# Patient Record
Sex: Male | Born: 1965
Health system: Southern US, Community
[De-identification: ages and names within clinical notes are randomized; demographics above are authoritative.]

## PROBLEM LIST (undated history)

## (undated) DIAGNOSIS — K219 Gastro-esophageal reflux disease without esophagitis: Secondary | ICD-10-CM

## (undated) DIAGNOSIS — K8020 Calculus of gallbladder without cholecystitis without obstruction: Secondary | ICD-10-CM

## (undated) DIAGNOSIS — E785 Hyperlipidemia, unspecified: Secondary | ICD-10-CM

## (undated) DIAGNOSIS — E119 Type 2 diabetes mellitus without complications: Secondary | ICD-10-CM

## (undated) DIAGNOSIS — M1990 Unspecified osteoarthritis, unspecified site: Secondary | ICD-10-CM

## (undated) HISTORY — DX: Hyperlipidemia, unspecified: E78.5

## (undated) HISTORY — DX: Gastro-esophageal reflux disease without esophagitis: K21.9

## (undated) HISTORY — DX: Calculus of gallbladder without cholecystitis without obstruction: K80.20

## (undated) HISTORY — DX: Unspecified osteoarthritis, unspecified site: M19.90

## (undated) HISTORY — PX: WISDOM TOOTH EXTRACTION: SHX21

## (undated) HISTORY — DX: Type 2 diabetes mellitus without complications: E11.9

---

## 2006-04-17 ENCOUNTER — Emergency Department (HOSPITAL_COMMUNITY): Admission: EM | Admit: 2006-04-17 | Discharge: 2006-04-17 | Payer: Self-pay | Admitting: Emergency Medicine

## 2006-04-30 ENCOUNTER — Encounter: Admission: RE | Admit: 2006-04-30 | Discharge: 2006-04-30 | Payer: Self-pay | Admitting: Family Medicine

## 2010-07-29 HISTORY — PX: KNEE ARTHROSCOPY: SUR90

## 2011-10-21 ENCOUNTER — Ambulatory Visit (INDEPENDENT_AMBULATORY_CARE_PROVIDER_SITE_OTHER): Payer: BC Managed Care – PPO | Admitting: Cardiovascular Disease

## 2011-10-21 ENCOUNTER — Encounter: Payer: Self-pay | Admitting: Cardiovascular Disease

## 2011-10-21 VITALS — BP 113/78 | HR 68 | Ht 74.0 in | Wt 234.0 lb

## 2011-10-21 DIAGNOSIS — R079 Chest pain, unspecified: Secondary | ICD-10-CM

## 2011-10-21 DIAGNOSIS — R002 Palpitations: Secondary | ICD-10-CM

## 2011-10-21 NOTE — Progress Notes (Signed)
   History of Present Illness: 46 yo male with history of HLD but no history of CAD or FH of CAD who is referred today for evaluation of fatigue, chest discomfort and palpitations. He was seen by Dr. Clelia Croft yesterday. He is an active male in the [employer] and has recently noticed a strange feeling in his chest at peak exercise. He has also felt his heart skipping beats. He was found to have an abnormal lipid profile recently and has been started on Crestor but has not tolerated with muscle weakness and fatigue so he stopped this.   He tells me that he has not been running since knee surgery in 2011. He feels that he is out of shape. He was at peak exercise last week and felt his heart skipping beats. He had a strange sensation in his chest that lasted for several seconds. He had another episode of palpitations the next night while in bed. His heart felt like it was racing. No exertional chest pressure but he does have a strange feeling in his chest with exercising. He has never smoked. No premature CAD in family. No prior cardiac issues.   Primary Care Physician: Buren Kos  Last Lipid Profile: Total chol: 269, HDL: 47, LDL: 139, TG:113  Past Medical History  Diagnosis Date  . Hyperlipidemia     Past Surgical History  Procedure Date  . Knee arthroscopy 5 /2012    Current Outpatient Prescriptions  Medication Sig Dispense Refill  . Ascorbic Acid (VITAMIN C) 100 MG tablet Take 100 mg by mouth daily.      . cholecalciferol (VITAMIN D) 1000 UNITS tablet Take 1,000 Units by mouth daily.        No Known Allergies  History   Social History  . Marital Status: Married    Spouse Name: N/A    Number of Children: N/A  . Years of Education: N/A   Occupational History  . Not on file.   Social History Main Topics  . Smoking status: Never Smoker   . Smokeless tobacco: Not on file  . Alcohol Use: Yes  . Drug Use: Not on file  . Sexually Active: Not on file   Other Topics Concern  . 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 Diabetes type II Mother   . Prostate cancer Father   . Hypertension Father   . Colon cancer Maternal Uncle     Review of Systems:  As stated in the HPI and otherwise negative.   BP 113/78  Pulse 68  Ht 6\' 2"  (1.88 m)  Wt 234 lb (106.142 kg)  BMI 30.04 kg/m2  Physical Examination: General: Well developed, well nourished, NAD HEENT: OP clear, mucus membranes moist SKIN: warm, dry. No rashes. Neuro: No focal deficits Musculoskeletal: Muscle strength 5/5 all ext Psychiatric: Mood and affect normal Neck: No JVD, no carotid bruits, no thyromegaly, no lymphadenopathy. Lungs:Clear bilaterally, no wheezes, rhonci, crackles Cardiovascular: Regular rate and rhythm. No murmurs, gallops or rubs. Abdomen:Soft. Bowel sounds present. Non-tender.  Extremities: No lower extremity edema. Pulses are 2 + in the bilateral DP/PT.  EKG: NSR, rate 68 bpm. Normal EKG

## 2011-10-21 NOTE — Assessment & Plan Note (Signed)
Most likely premature beats. Will arrange 48 hour holter monitor to assess and exclude any other arrythmias.

## 2011-10-21 NOTE — Assessment & Plan Note (Signed)
Atypical chest pain. EKG normal. Given his age, fatigue and hyperlipidemia, will arrange exercise treadmill stress test to evaluate exercise tolerance and exclude ischemia. Will also check echo to assess LV size and function.

## 2011-10-21 NOTE — Patient Instructions (Addendum)
Your physician has requested that you have an echocardiogram. Echocardiography is a painless test that uses sound waves to create images of your heart. It provides your doctor with information about the size and shape of your heart and how well your heart's chambers and valves are working. This procedure takes approximately one hour. There are no restrictions for this procedure.  Your physician has requested that you have an exercise tolerance test. For further information please visit [email or web address] Please also follow instruction sheet, as given.  Your physician has recommended that you wear a holter monitor. Holter monitors are medical devices that record the heart's electrical activity. Doctors most often use these monitors to diagnose arrhythmias. Arrhythmias are problems with the speed or rhythm of the heartbeat. The monitor is a small, portable device. You can wear one while you do your normal daily activities. This is usually used to diagnose what is causing palpitations/syncope (passing out).  Your physician recommends that you schedule a follow-up appointment in: 3-4 weeks

## 2011-11-10 ENCOUNTER — Other Ambulatory Visit: Payer: Self-pay

## 2011-11-10 ENCOUNTER — Ambulatory Visit (HOSPITAL_COMMUNITY): Payer: BC Managed Care – PPO | Attending: Cardiovascular Disease | Admitting: Radiology

## 2011-11-10 ENCOUNTER — Encounter (INDEPENDENT_AMBULATORY_CARE_PROVIDER_SITE_OTHER): Payer: BC Managed Care – PPO

## 2011-11-10 DIAGNOSIS — R079 Chest pain, unspecified: Secondary | ICD-10-CM | POA: Insufficient documentation

## 2011-11-10 DIAGNOSIS — R0989 Other specified symptoms and signs involving the circulatory and respiratory systems: Secondary | ICD-10-CM

## 2011-11-10 DIAGNOSIS — E785 Hyperlipidemia, unspecified: Secondary | ICD-10-CM | POA: Insufficient documentation

## 2011-11-10 DIAGNOSIS — R072 Precordial pain: Secondary | ICD-10-CM | POA: Insufficient documentation

## 2011-11-10 DIAGNOSIS — R002 Palpitations: Secondary | ICD-10-CM | POA: Insufficient documentation

## 2011-11-10 DIAGNOSIS — I517 Cardiomegaly: Secondary | ICD-10-CM | POA: Insufficient documentation

## 2011-11-10 NOTE — Progress Notes (Signed)
Echocardiogram performed.  

## 2011-11-20 ENCOUNTER — Encounter: Payer: Self-pay | Admitting: Cardiovascular Disease

## 2011-11-20 ENCOUNTER — Ambulatory Visit (INDEPENDENT_AMBULATORY_CARE_PROVIDER_SITE_OTHER): Payer: BC Managed Care – PPO | Admitting: Cardiovascular Disease

## 2011-11-20 DIAGNOSIS — R079 Chest pain, unspecified: Secondary | ICD-10-CM

## 2011-11-20 NOTE — Procedures (Signed)
Exercise Treadmill Test  Pre-Exercise Testing Evaluation Rhythm: normal sinus  Rate: 65   PR:  .18 QRS:  .09  QT:  .41 QTc: .42     Test  Exercise Tolerance Test Ordering MD: Melene Muller, MD  Interpreting MD: Melene Muller, MD  Unique Test No: 1  Treadmill:  1  Indication for ETT: chest pain - rule out ischemia  Contraindication to ETT: No   Stress Modality: exercise - treadmill  Cardiac Imaging Performed: non   Protocol: standard Bruce - maximal  Max BP:  159/69  Max MPHR (bpm):  175 85% MPR (bpm):  149  MPHR obtained (bpm):  171 % MPHR obtained:  97%  Reached 85% MPHR (min:sec): 6:50 Total Exercise Time (min-sec):  9 min-30sec  Workload in METS:  10.9 Borg Scale: 15  Reason ETT Terminated:  dyspnea    ST Segment Analysis At Rest: normal ST segments - no evidence of significant ST depression With Exercise: no evidence of significant ST depression  Other Information Arrhythmia:  No Angina during ETT:  absent (0) Quality of ETT:  non-diagnostic  ETT Interpretation:  normal - no evidence of ischemia by ST analysis  Comments: Pt exercised for 9 minutes, 30 seconds achieving target heart rate. No EKG evidence of ischemia. No chest pain. Exercise tolerance is good.   Recommendations: No further ischemic workup.

## 2011-11-25 ENCOUNTER — Telehealth: Payer: Self-pay | Admitting: *Deleted

## 2011-11-25 NOTE — Telephone Encounter (Signed)
Pt did not want to have monitor put on until he had treadmill. Pt did not discuss with Dr. Clifton James at time of treadmill. Per Dr. Clifton James if pt is still having palpitations he should wear monitor. If no palpitations he does not need to wear but should contact us if palpitations occur again. I called pt to discuss and left message for him to call back

## 2011-12-02 NOTE — Telephone Encounter (Signed)
Left message to call back  

## 2011-12-03 NOTE — Telephone Encounter (Signed)
Pt returning my call--asked if having any palpitations, pt states no--advised he won't have to wear monitor if he's not having palps, but if palpitations develop, please call us so we may put monitor on you--pt agrees

## 2011-12-03 NOTE — Telephone Encounter (Signed)
Pt rtn call, added cell number as only number would like a call back

## 2011-12-04 NOTE — Addendum Note (Signed)
Addended by: Dossie Arbour on: 12/04/2011 08:48 AM   Modules accepted: Orders

## 2013-03-20 ENCOUNTER — Encounter: Payer: Self-pay | Admitting: Internal Medicine

## 2013-04-17 ENCOUNTER — Encounter: Payer: Self-pay | Admitting: Internal Medicine

## 2013-04-18 ENCOUNTER — Encounter: Payer: Self-pay | Admitting: Internal Medicine

## 2013-04-18 ENCOUNTER — Ambulatory Visit (INDEPENDENT_AMBULATORY_CARE_PROVIDER_SITE_OTHER): Payer: BC Managed Care – PPO | Admitting: Internal Medicine

## 2013-04-18 VITALS — BP 126/82 | HR 70 | Ht 72.0 in | Wt 245.0 lb

## 2013-04-18 DIAGNOSIS — Z800 Family history of malignant neoplasm of digestive organs: Secondary | ICD-10-CM

## 2013-04-18 DIAGNOSIS — Z1211 Encounter for screening for malignant neoplasm of colon: Secondary | ICD-10-CM

## 2013-04-18 MED ORDER — MOVIPREP 100 G PO SOLR: 1.0000 | Freq: Once | ORAL | Status: DC

## 2013-04-18 NOTE — Progress Notes (Signed)
Patient ID: Garrett Williams, male   DOB: Sep 05, 1965, 48 y.o.   MRN: [id number] HPI: Garrett Williams is a 48 year old male with a past medical history of hyperlipidemia and a family history of colon cancer on both sides of his family who is seen in consultation at the request of Dr. Brigitte Pulse to discuss high-risk colon cancer screening. He is here alone today. He reports he is feeling well. He has no specific GI complaints including no change in bowel habit, diarrhea, constipation, abdominal pain, blood in his stool or melena. No trouble with heartburn, dysphagia or odynophagia. No unexpected weight loss.  He reports his father is deal of colon cancer currently. Diagnosed at age 41. He reports 2 maternal uncles who have had colon cancer.  Past Medical History  Diagnosis Date  . Hyperlipidemia     Past Surgical History  Procedure Laterality Date  . Knee arthroscopy  5 /2012    Current Outpatient Prescriptions  Medication Sig Dispense Refill  . Ascorbic Acid (VITAMIN C) 100 MG tablet Take 100 mg by mouth daily.      . cholecalciferol (VITAMIN D) 1000 UNITS tablet Take 1,000 Units by mouth daily.      . Multiple Vitamin (MULTIVITAMIN) tablet Take 1 tablet by mouth daily.      Marland Kitchen MOVIPREP 100 G SOLR Take 1 kit (200 g total) by mouth once.  1 kit  0   No current facility-administered medications for this visit.    No Known Allergies  Family History  Problem Relation Age of Onset  . Diabetes type II Mother   . Prostate cancer Father   . Hypertension Father   . Colon cancer Maternal Uncle   . Coronary artery disease Neg Hx   . Colon cancer Father 11    History  Substance Use Topics  . Smoking status: Never Smoker   . Smokeless tobacco: Not on file  . Alcohol Use: 0.5 oz/week    1 drink(s) per week    ROS: As per history of present illness, otherwise negative  BP 126/82  Pulse 70  Ht 6' (1.829 m)  Wt 245 lb (111.131 kg)  BMI 33.22 kg/m2 Constitutional: Well-developed and  well-nourished. No distress. HEENT: Normocephalic and atraumatic. Oropharynx is clear and moist. No oropharyngeal exudate. Conjunctivae are normal.  No scleral icterus. Neck: Neck supple. Trachea midline. Cardiovascular: Normal rate, regular rhythm and intact distal pulses. No M/R/G Pulmonary/chest: Effort normal and breath sounds normal. No wheezing, rales or rhonchi. Abdominal: Soft, nontender, nondistended. Bowel sounds active throughout. There are no masses palpable. No hepatosplenomegaly. Extremities: no clubbing, cyanosis, or edema Lymphadenopathy: No cervical adenopathy noted. Neurological: Alert and oriented to person place and time. Skin: Skin is warm and dry. No rashes noted. Psychiatric: Normal mood and affect. Behavior is normal.  ASSESSMENT/PLAN: 48 year old male with a past medical history of hyperlipidemia and a family history of colon cancer on both sides of his family who is seen in consultation at the request of Dr. Brigitte Pulse to discuss high-risk colon cancer screening.  1.  Elevated risk screening for colon cancer/family history of colon cancer -- I have recommended proceeding to screening colonoscopy at this time given his age and family history of colon cancer. We discussed the test today at length including risks and benefits and he is agreeable to proceed.

## 2013-04-18 NOTE — Patient Instructions (Addendum)
You have been scheduled for a colonoscopy with propofol. Please follow written instructions given to you at your visit today.  Please pick up your prep kit at the pharmacy within the next 1-3 days. If you use inhalers (even only as needed), please bring them with you on the day of your procedure. CC:  Lutricia Feil MD

## 2013-04-19 ENCOUNTER — Encounter: Payer: Self-pay | Admitting: Internal Medicine

## 2013-04-21 ENCOUNTER — Telehealth: Payer: Self-pay | Admitting: Internal Medicine

## 2013-04-21 NOTE — Telephone Encounter (Signed)
Pt could not find a caregiver to bring her, so he needs to r/s;done. Will send him a new prep sheet.

## 2013-04-25 ENCOUNTER — Encounter: Payer: BC Managed Care – PPO | Admitting: Internal Medicine

## 2013-04-30 HISTORY — PX: COLONOSCOPY: SHX174

## 2013-05-08 ENCOUNTER — Telehealth: Payer: Self-pay | Admitting: Internal Medicine

## 2013-05-08 NOTE — Telephone Encounter (Signed)
Patient stating he read his directions yesterday after eating almonds around lunch yesterday. Called to be sure its ok that he ate the almonds. Reviewed prep instructions with patient. Patient verbalizing understanding. Patient on clear liquids today.

## 2013-05-09 ENCOUNTER — Encounter: Payer: Self-pay | Admitting: Internal Medicine

## 2013-05-09 ENCOUNTER — Ambulatory Visit (AMBULATORY_SURGERY_CENTER): Payer: BC Managed Care – PPO | Admitting: Internal Medicine

## 2013-05-09 VITALS — BP 124/74 | HR 64 | Temp 97.6°F | Resp 15 | Ht 72.0 in | Wt 245.0 lb

## 2013-05-09 DIAGNOSIS — D126 Benign neoplasm of colon, unspecified: Secondary | ICD-10-CM

## 2013-05-09 DIAGNOSIS — D128 Benign neoplasm of rectum: Secondary | ICD-10-CM

## 2013-05-09 DIAGNOSIS — D129 Benign neoplasm of anus and anal canal: Secondary | ICD-10-CM

## 2013-05-09 DIAGNOSIS — Z1211 Encounter for screening for malignant neoplasm of colon: Secondary | ICD-10-CM

## 2013-05-09 DIAGNOSIS — Z800 Family history of malignant neoplasm of digestive organs: Secondary | ICD-10-CM

## 2013-05-09 MED ORDER — SODIUM CHLORIDE 0.9 % IV SOLN: 500.0000 mL | INTRAVENOUS | Status: DC

## 2013-05-09 NOTE — Patient Instructions (Signed)
YOU HAD AN ENDOSCOPIC PROCEDURE TODAY AT THE [facility]: Refer to the procedure report that was given to you for any specific questions about what was found during the examination.  If the procedure report does not answer your questions, please call your gastroenterologist to clarify.  If you requested that your care partner not be given the details of your procedure findings, then the procedure report has been included in a sealed envelope for you to review at your convenience later.  YOU SHOULD EXPECT: Some feelings of bloating in the abdomen. Passage of more gas than usual.  Walking can help get rid of the air that was put into your GI tract during the procedure and reduce the bloating. If you had a lower endoscopy (such as a colonoscopy or flexible sigmoidoscopy) you may notice spotting of blood in your stool or on the toilet paper. If you underwent a bowel prep for your procedure, then you may not have a normal bowel movement for a few days.  DIET: Your first meal following the procedure should be a light meal and then it is ok to progress to your normal diet.  A half-sandwich or bowl of soup is an example of a good first meal.  Heavy or fried foods are harder to digest and may make you feel nauseous or bloated.  Likewise meals heavy in dairy and vegetables can cause extra gas to form and this can also increase the bloating.  Drink plenty of fluids but you should avoid alcoholic beverages for 24 hours.  ACTIVITY: Your care partner should take you home directly after the procedure.  You should plan to take it easy, moving slowly for the rest of the day.  You can resume normal activity the day after the procedure however you should NOT DRIVE or use heavy machinery for 24 hours (because of the sedation medicines used during the test).    SYMPTOMS TO REPORT IMMEDIATELY: A gastroenterologist can be reached at any hour.  During normal business hours, 8:30 AM to 5:00 PM Monday through Friday,  call [phone].  After hours and on weekends, please call the GI answering service at [phone] who will take a message and have the physician on call contact you.   Following lower endoscopy (colonoscopy or flexible sigmoidoscopy):  Excessive amounts of blood in the stool  Significant tenderness or worsening of abdominal pains  Swelling of the abdomen that is new, acute  Fever of 100F or higher  FOLLOW UP: If any biopsies were taken you will be contacted by phone or by letter within the next 1-3 weeks.  Call your gastroenterologist if you have not heard about the biopsies in 3 weeks.  Our staff will call the home number listed on your records the next business day following your procedure to check on you and address any questions or concerns that you may have at that time regarding the information given to you following your procedure. This is a courtesy call and so if there is no answer at the home number and we have not heard from you through the emergency physician on call, we will assume that you have returned to your regular daily activities without incident.  SIGNATURES/CONFIDENTIALITY: You and/or your care partner have signed paperwork which will be entered into your electronic medical record.  These signatures attest to the fact that that the information above on your After Visit Summary has been reviewed and is understood.  Full responsibility of the confidentiality of this   discharge information lies with you and/or your care-partner.  Recommendations Await pathology results Timing of repeat colonoscopy will be determined by pathology findings

## 2013-05-09 NOTE — Op Note (Signed)
[facility]  [address] [place], [address]   COLONOSCOPY PROCEDURE REPORT  PATIENT: Garrett Williams, Garrett Williams  MR#: [id number] BIRTHDATE: [birth year] , 81  yrs. old GENDER: Male ENDOSCOPIST: Jerene Bears, MD REFERRED BY:W.  Lutricia Feil, M.D. PROCEDURE DATE:  05/09/2013 PROCEDURE:   Colonoscopy with snare polypectomy and Colonoscopy with cold biopsy polypectomy First Screening Colonoscopy - Avg.  risk and is 50 yrs.  old or older Yes.  Prior Negative Screening - Now for repeat screening. N/A  History of Adenoma - Now for follow-up colonoscopy & has been > or = to 3 yrs.  N/A  Polyps Removed Today? Yes. ASA CLASS:   Class II INDICATIONS:elevated risk screening, Patient's immediate family history of colon cancer, and first colonoscopy. MEDICATIONS: MAC sedation, administered by CRNA and propofol (Diprivan) 400mg  IV  DESCRIPTION OF PROCEDURE:   After the risks benefits and alternatives of the procedure were thoroughly explained, informed consent was obtained.  A digital rectal exam revealed no rectal mass.   The LB [id number]  endoscope was introduced through the anus and advanced to the cecum, which was identified by both the appendix and ileocecal valve. No adverse events experienced. The quality of the prep was good, using MoviPrep  The instrument was then slowly withdrawn as the colon was fully examined.  COLON FINDINGS: Two sessile polyps ranging between 3-58mm in size with mucous caps were found at the cecum.  Polypectomy was performed with cold forceps and using cold snare.  All resections were complete and all polyp tissue was completely retrieved.   Two sessile polyps measuring 2-4 mm in size were found in the rectum. Polypectomy was performed with cold forceps.  All resections were complete and all polyp tissue was completely retrieved.   The colon mucosa was otherwise normal.  Retroflexed views revealed no abnormalities. The time to cecum=2 minutes 02  seconds.  Withdrawal time=16 minutes 03 seconds.  The scope was withdrawn and the procedure completed. COMPLICATIONS: There were no complications.  ENDOSCOPIC IMPRESSION: 1.   Two sessile polyps ranging between 3-85mm in size were found at the cecum; Polypectomy was performed with cold forceps and using cold snare 2.   Two sessile polyps measuring 2-4 mm in size were found in the rectum; Polypectomy was performed with cold forceps 3.   The colon mucosa was otherwise normal  RECOMMENDATIONS: 1.  Await pathology results 2.  Timing of repeat colonoscopy will be determined by pathology findings. 3.  You will receive a letter within 1-2 weeks with the results of your biopsy as well as final recommendations.  Please call my office if you have not received a letter after 3 weeks.   eSigned:  Jerene Bears, MD 05/09/2013 3:33 PM       cc: The Patient and W.  Lutricia Feil, MD

## 2013-05-09 NOTE — Progress Notes (Signed)
Report to pacu rn, vss, bbs=clear 

## 2013-05-09 NOTE — Progress Notes (Signed)
Called to room to assist during endoscopic procedure.  Patient ID and intended procedure confirmed with present staff. Received instructions for my participation in the procedure from the performing physician.  

## 2013-05-10 ENCOUNTER — Telehealth: Payer: Self-pay | Admitting: *Deleted

## 2013-05-10 NOTE — Telephone Encounter (Signed)
Lm on vm to return call if problems or questions on number given in admitting yesterday. ewm

## 2013-05-21 ENCOUNTER — Encounter: Payer: Self-pay | Admitting: Internal Medicine

## 2013-05-26 ENCOUNTER — Telehealth: Payer: Self-pay | Admitting: Internal Medicine

## 2013-05-26 NOTE — Telephone Encounter (Signed)
Left message for patient to call back  

## 2013-05-29 NOTE — Telephone Encounter (Signed)
Only 2 of the polyps, the 2 from the cecum, where sessile serrated polyps The rectal polyps were benign and NOT adenomatous Based on these findings, national surveillance guidelines support repeat colonoscopy in 5 years, even in light of family history I'm happy to discuss this with him by phone if you would like

## 2013-05-29 NOTE — Telephone Encounter (Signed)
Left message for patient to call back  

## 2013-05-29 NOTE — Telephone Encounter (Signed)
Patient states that you and he discussed a 3 year recall if his polyps are adenomatous.  He has a history of colon cancer in his father.  Please review and advise

## 2013-05-30 NOTE — Telephone Encounter (Signed)
Left message for patient to call back  

## 2013-05-30 NOTE — Telephone Encounter (Signed)
Patient notified.  He will call back for any additional questions or concerns.  

## 2015-12-10 ENCOUNTER — Other Ambulatory Visit: Payer: Self-pay | Admitting: Internal Medicine

## 2015-12-10 DIAGNOSIS — IMO0001 Reserved for inherently not codable concepts without codable children: Secondary | ICD-10-CM

## 2015-12-10 DIAGNOSIS — R209 Unspecified disturbances of skin sensation: Principal | ICD-10-CM

## 2015-12-20 ENCOUNTER — Other Ambulatory Visit: Payer: Self-pay

## 2016-02-19 DIAGNOSIS — Z0000 Encounter for general adult medical examination without abnormal findings: Secondary | ICD-10-CM | POA: Diagnosis not present

## 2016-02-19 DIAGNOSIS — Z1389 Encounter for screening for other disorder: Secondary | ICD-10-CM | POA: Diagnosis not present

## 2016-02-19 DIAGNOSIS — E298 Other testicular dysfunction: Secondary | ICD-10-CM | POA: Diagnosis not present

## 2016-02-19 DIAGNOSIS — E784 Other hyperlipidemia: Secondary | ICD-10-CM | POA: Diagnosis not present

## 2016-12-03 DIAGNOSIS — Z6831 Body mass index (BMI) 31.0-31.9, adult: Secondary | ICD-10-CM | POA: Diagnosis not present

## 2016-12-03 DIAGNOSIS — R351 Nocturia: Secondary | ICD-10-CM | POA: Diagnosis not present

## 2016-12-03 DIAGNOSIS — N410 Acute prostatitis: Secondary | ICD-10-CM | POA: Diagnosis not present

## 2017-03-09 DIAGNOSIS — Z125 Encounter for screening for malignant neoplasm of prostate: Secondary | ICD-10-CM | POA: Diagnosis not present

## 2017-03-09 DIAGNOSIS — Z0000 Encounter for general adult medical examination without abnormal findings: Secondary | ICD-10-CM | POA: Diagnosis not present

## 2017-03-09 DIAGNOSIS — E298 Other testicular dysfunction: Secondary | ICD-10-CM | POA: Diagnosis not present

## 2017-03-09 DIAGNOSIS — Z1389 Encounter for screening for other disorder: Secondary | ICD-10-CM | POA: Diagnosis not present

## 2017-03-09 DIAGNOSIS — Z8042 Family history of malignant neoplasm of prostate: Secondary | ICD-10-CM | POA: Diagnosis not present

## 2017-03-09 DIAGNOSIS — E668 Other obesity: Secondary | ICD-10-CM | POA: Diagnosis not present

## 2017-03-09 DIAGNOSIS — E7849 Other hyperlipidemia: Secondary | ICD-10-CM | POA: Diagnosis not present

## 2017-04-21 DIAGNOSIS — G8929 Other chronic pain: Secondary | ICD-10-CM | POA: Diagnosis not present

## 2017-04-21 DIAGNOSIS — M25561 Pain in right knee: Secondary | ICD-10-CM | POA: Diagnosis not present

## 2017-04-21 DIAGNOSIS — S83231A Complex tear of medial meniscus, current injury, right knee, initial encounter: Secondary | ICD-10-CM | POA: Diagnosis not present

## 2017-04-22 DIAGNOSIS — K219 Gastro-esophageal reflux disease without esophagitis: Secondary | ICD-10-CM | POA: Diagnosis not present

## 2017-04-22 DIAGNOSIS — R0602 Shortness of breath: Secondary | ICD-10-CM | POA: Diagnosis not present

## 2017-04-22 DIAGNOSIS — R0789 Other chest pain: Secondary | ICD-10-CM | POA: Diagnosis not present

## 2017-04-22 DIAGNOSIS — R079 Chest pain, unspecified: Secondary | ICD-10-CM | POA: Diagnosis not present

## 2017-05-07 DIAGNOSIS — R079 Chest pain, unspecified: Secondary | ICD-10-CM | POA: Diagnosis not present

## 2017-10-28 ENCOUNTER — Other Ambulatory Visit: Payer: Self-pay | Admitting: Internal Medicine

## 2017-10-28 DIAGNOSIS — K219 Gastro-esophageal reflux disease without esophagitis: Secondary | ICD-10-CM | POA: Diagnosis not present

## 2017-10-28 DIAGNOSIS — M546 Pain in thoracic spine: Secondary | ICD-10-CM | POA: Diagnosis not present

## 2017-10-28 DIAGNOSIS — R1013 Epigastric pain: Secondary | ICD-10-CM | POA: Diagnosis not present

## 2017-10-28 DIAGNOSIS — R351 Nocturia: Secondary | ICD-10-CM | POA: Diagnosis not present

## 2017-10-28 DIAGNOSIS — R109 Unspecified abdominal pain: Secondary | ICD-10-CM

## 2017-11-03 ENCOUNTER — Other Ambulatory Visit: Payer: Self-pay

## 2017-11-05 DIAGNOSIS — K760 Fatty (change of) liver, not elsewhere classified: Secondary | ICD-10-CM | POA: Diagnosis not present

## 2017-11-05 DIAGNOSIS — K8020 Calculus of gallbladder without cholecystitis without obstruction: Secondary | ICD-10-CM | POA: Diagnosis not present

## 2017-11-08 DIAGNOSIS — K8020 Calculus of gallbladder without cholecystitis without obstruction: Secondary | ICD-10-CM | POA: Diagnosis not present

## 2017-11-08 DIAGNOSIS — Z1211 Encounter for screening for malignant neoplasm of colon: Secondary | ICD-10-CM | POA: Diagnosis not present

## 2017-11-08 DIAGNOSIS — Z800 Family history of malignant neoplasm of digestive organs: Secondary | ICD-10-CM | POA: Diagnosis not present

## 2018-03-08 DIAGNOSIS — R82998 Other abnormal findings in urine: Secondary | ICD-10-CM | POA: Diagnosis not present

## 2018-03-08 DIAGNOSIS — E7849 Other hyperlipidemia: Secondary | ICD-10-CM | POA: Diagnosis not present

## 2018-03-08 DIAGNOSIS — Z0000 Encounter for general adult medical examination without abnormal findings: Secondary | ICD-10-CM | POA: Diagnosis not present

## 2018-03-08 DIAGNOSIS — R7301 Impaired fasting glucose: Secondary | ICD-10-CM | POA: Diagnosis not present

## 2018-03-15 DIAGNOSIS — Z125 Encounter for screening for malignant neoplasm of prostate: Secondary | ICD-10-CM | POA: Diagnosis not present

## 2018-03-15 DIAGNOSIS — R7301 Impaired fasting glucose: Secondary | ICD-10-CM | POA: Diagnosis not present

## 2018-03-15 DIAGNOSIS — K8020 Calculus of gallbladder without cholecystitis without obstruction: Secondary | ICD-10-CM | POA: Diagnosis not present

## 2018-03-15 DIAGNOSIS — E7849 Other hyperlipidemia: Secondary | ICD-10-CM | POA: Diagnosis not present

## 2018-03-15 DIAGNOSIS — Z0000 Encounter for general adult medical examination without abnormal findings: Secondary | ICD-10-CM | POA: Diagnosis not present

## 2018-03-15 DIAGNOSIS — Z6832 Body mass index (BMI) 32.0-32.9, adult: Secondary | ICD-10-CM | POA: Diagnosis not present

## 2018-03-15 DIAGNOSIS — M25561 Pain in right knee: Secondary | ICD-10-CM | POA: Diagnosis not present

## 2018-05-05 ENCOUNTER — Encounter: Payer: Self-pay | Admitting: *Deleted

## 2018-05-28 ENCOUNTER — Encounter: Payer: Self-pay | Admitting: Internal Medicine

## 2018-07-21 DIAGNOSIS — R7301 Impaired fasting glucose: Secondary | ICD-10-CM | POA: Diagnosis not present

## 2018-07-21 DIAGNOSIS — M25561 Pain in right knee: Secondary | ICD-10-CM | POA: Diagnosis not present

## 2018-09-19 DIAGNOSIS — E785 Hyperlipidemia, unspecified: Secondary | ICD-10-CM | POA: Diagnosis not present

## 2018-09-19 DIAGNOSIS — R7301 Impaired fasting glucose: Secondary | ICD-10-CM | POA: Diagnosis not present

## 2018-09-19 DIAGNOSIS — K219 Gastro-esophageal reflux disease without esophagitis: Secondary | ICD-10-CM | POA: Diagnosis not present

## 2018-09-19 DIAGNOSIS — E669 Obesity, unspecified: Secondary | ICD-10-CM | POA: Diagnosis not present

## 2018-12-16 DIAGNOSIS — E119 Type 2 diabetes mellitus without complications: Secondary | ICD-10-CM | POA: Diagnosis not present

## 2018-12-16 DIAGNOSIS — H40033 Anatomical narrow angle, bilateral: Secondary | ICD-10-CM | POA: Diagnosis not present

## 2019-01-03 ENCOUNTER — Encounter: Payer: Self-pay | Admitting: Internal Medicine

## 2019-01-13 DIAGNOSIS — R1013 Epigastric pain: Secondary | ICD-10-CM | POA: Diagnosis not present

## 2019-01-13 DIAGNOSIS — K8020 Calculus of gallbladder without cholecystitis without obstruction: Secondary | ICD-10-CM | POA: Diagnosis not present

## 2019-01-18 ENCOUNTER — Other Ambulatory Visit: Payer: Self-pay | Admitting: Internal Medicine

## 2019-01-18 DIAGNOSIS — R1013 Epigastric pain: Secondary | ICD-10-CM

## 2019-01-27 ENCOUNTER — Ambulatory Visit
Admission: RE | Admit: 2019-01-27 | Discharge: 2019-01-27 | Disposition: A | Discharge status: Home / Self Care | Payer: Self-pay | Source: Ambulatory Visit | Attending: Internal Medicine | Admitting: Internal Medicine

## 2019-01-27 DIAGNOSIS — R1013 Epigastric pain: Secondary | ICD-10-CM

## 2019-01-27 DIAGNOSIS — K8020 Calculus of gallbladder without cholecystitis without obstruction: Secondary | ICD-10-CM | POA: Diagnosis not present

## 2019-01-27 DIAGNOSIS — K760 Fatty (change of) liver, not elsewhere classified: Secondary | ICD-10-CM | POA: Diagnosis not present

## 2019-02-06 ENCOUNTER — Other Ambulatory Visit: Payer: Self-pay

## 2019-02-06 ENCOUNTER — Ambulatory Visit (AMBULATORY_SURGERY_CENTER): Payer: BC Managed Care – PPO | Admitting: *Deleted

## 2019-02-06 VITALS — Ht 72.0 in | Wt 248.4 lb

## 2019-02-06 DIAGNOSIS — Z1159 Encounter for screening for other viral diseases: Secondary | ICD-10-CM

## 2019-02-06 DIAGNOSIS — Z86010 Personal history of colonic polyps: Secondary | ICD-10-CM

## 2019-02-06 MED ORDER — SUPREP BOWEL PREP KIT 17.5-3.13-1.6 GM/177ML PO SOLN: 1.0000 | Freq: Once | ORAL | 0 refills | Status: AC

## 2019-02-06 NOTE — Progress Notes (Signed)
Patient denies any allergies to egg or soy products. Patient denies complications with anesthesia/sedation.  Patient denies oxygen use at home and denies diet medications. Emmi instructions for colonoscopy/endoscopy explained and given to patient.   

## 2019-02-07 ENCOUNTER — Encounter: Payer: Self-pay | Admitting: Internal Medicine

## 2019-02-15 ENCOUNTER — Other Ambulatory Visit: Payer: Self-pay | Admitting: Internal Medicine

## 2019-02-15 ENCOUNTER — Ambulatory Visit (INDEPENDENT_AMBULATORY_CARE_PROVIDER_SITE_OTHER): Payer: BLUE CROSS/BLUE SHIELD

## 2019-02-15 DIAGNOSIS — Z1159 Encounter for screening for other viral diseases: Secondary | ICD-10-CM

## 2019-02-16 LAB — SARS CORONAVIRUS 2 (TAT 6-24 HRS): SARS Coronavirus 2: NEGATIVE

## 2019-02-19 DIAGNOSIS — Z1211 Encounter for screening for malignant neoplasm of colon: Secondary | ICD-10-CM | POA: Diagnosis not present

## 2019-02-20 ENCOUNTER — Other Ambulatory Visit: Payer: Self-pay

## 2019-02-20 ENCOUNTER — Ambulatory Visit (AMBULATORY_SURGERY_CENTER): Admitting: Internal Medicine

## 2019-02-20 ENCOUNTER — Encounter: Payer: Self-pay | Admitting: Internal Medicine

## 2019-02-20 VITALS — BP 112/75 | HR 60 | Temp 98.7°F | Resp 16 | Ht 72.0 in | Wt 248.4 lb

## 2019-02-20 DIAGNOSIS — D124 Benign neoplasm of descending colon: Secondary | ICD-10-CM

## 2019-02-20 DIAGNOSIS — Z800 Family history of malignant neoplasm of digestive organs: Secondary | ICD-10-CM | POA: Diagnosis not present

## 2019-02-20 DIAGNOSIS — D123 Benign neoplasm of transverse colon: Secondary | ICD-10-CM | POA: Diagnosis not present

## 2019-02-20 DIAGNOSIS — Z86010 Personal history of colonic polyps: Secondary | ICD-10-CM

## 2019-02-20 MED ORDER — SODIUM CHLORIDE 0.9 % IV SOLN: 500.0000 mL | Freq: Once | INTRAVENOUS | Status: DC

## 2019-02-20 NOTE — Progress Notes (Signed)
VS- [facility] Temperature- June Bullock  Pt's states no medical or surgical changes since previsit or office visit.

## 2019-02-20 NOTE — Op Note (Signed)
[place] Patient Name: Garrett Williams Procedure Date: 02/20/2019 9:11 AM MRN: [id number] Endoscopist: Jerene Bears , MD Age: 53 Referring MD:  Date of Birth: 1965-06-28 Gender: Male Account #: [id number] Procedure:                Colonoscopy Indications:              High risk colon cancer surveillance: Personal                            history of non-advanced adenoma, Family history of                            colon cancer in a first-degree relative, Last                            colonoscopy 5 years ago Medicines:                Monitored Anesthesia Care Procedure:                Pre-Anesthesia Assessment:                           - Prior to the procedure, a History and Physical                            was performed, and patient medications and                            allergies were reviewed. The patient's tolerance of                            previous anesthesia was also reviewed. The risks                            and benefits of the procedure and the sedation                            options and risks were discussed with the patient.                            All questions were answered, and informed consent                            was obtained. Prior Anticoagulants: The patient has                            taken no previous anticoagulant or antiplatelet                            agents. ASA Grade Assessment: II - A patient with                            mild systemic disease. After reviewing the risks  and benefits, the patient was deemed in                            satisfactory condition to undergo the procedure.                           After obtaining informed consent, the colonoscope                            was passed under direct vision. Throughout the                            procedure, the patient's blood pressure, pulse, and                            oxygen saturations were monitored continuously.  The                            Colonoscope was introduced through the anus and                            advanced to the cecum, identified by appendiceal                            orifice and ileocecal valve. The colonoscopy was                            performed without difficulty. The patient tolerated                            the procedure well. The quality of the bowel                            preparation was excellent. The ileocecal valve,                            appendiceal orifice, and rectum were photographed. Scope In: 9:19:13 AM Scope Out: 9:37:02 AM Scope Withdrawal Time: 0 hours 14 minutes 25 seconds  Total Procedure Duration: 0 hours 17 minutes 49 seconds  Findings:                 The digital rectal exam was normal.                           A 2 mm polyp was found in the transverse colon. The                            polyp was sessile. The polyp was removed with a                            cold biopsy forceps. Resection and retrieval were                            complete.  Two sessile polyps were found in the descending                            colon. The polyps were 2 to 3 mm in size. These                            polyps were removed with a cold biopsy forceps.                            Resection and retrieval were complete.                           A few small-mouthed diverticula were found in the                            sigmoid colon, descending colon and ascending colon.                           Internal hemorrhoids were found during retroflexion. Complications:            No immediate complications. Estimated Blood Loss:     Estimated blood loss: none. Impression:               - One 2 mm polyp in the transverse colon, removed                            with a cold biopsy forceps. Resected and retrieved.                           - Two 2 to 3 mm polyps in the descending colon,                            removed with  a cold biopsy forceps. Resected and                            retrieved.                           - Diverticulosis in the sigmoid colon, in the                            descending colon and in the ascending colon.                           - Internal hemorrhoids. Recommendation:           - Patient has a contact number available for                            emergencies. The signs and symptoms of potential                            delayed complications were discussed with the  patient. Return to normal activities tomorrow.                            Written discharge instructions were provided to the                            patient.                           - Resume previous diet.                           - Continue present medications.                           - Await pathology results.                           - Repeat colonoscopy in 5 years for surveillance. Jerene Bears, MD 02/20/2019 9:43:50 AM This report has been signed electronically.

## 2019-02-20 NOTE — Patient Instructions (Signed)
Thank you for allowing [place] to care for you today!  Await pathology results by mail, approximately 2 weeks.    Recommend repeat colonoscopy in 5 years for surveillance.  Resume previous diet and medications today.  Return to your normal activities tomorrow.     YOU HAD AN ENDOSCOPIC PROCEDURE TODAY AT [facility]:   Refer to the procedure report that was given to you for any specific questions about what was found during the examination.  If the procedure report does not answer your questions, please call your gastroenterologist to clarify.  If you requested that your care partner not be given the details of your procedure findings, then the procedure report has been included in a sealed envelope for you to review at your convenience later.  YOU SHOULD EXPECT: Some feelings of bloating in the abdomen. Passage of more gas than usual.  Walking can help get rid of the air that was put into your GI tract during the procedure and reduce the bloating. If you had a lower endoscopy (such as a colonoscopy or flexible sigmoidoscopy) you may notice spotting of blood in your stool or on the toilet paper. If you underwent a bowel prep for your procedure, you may not have a normal bowel movement for a few days.  Please Note:  You might notice some irritation and congestion in your nose or some drainage.  This is from the oxygen used during your procedure.  There is no need for concern and it should clear up in a day or so.  SYMPTOMS TO REPORT IMMEDIATELY:   Following lower endoscopy (colonoscopy or flexible sigmoidoscopy):  Excessive amounts of blood in the stool  Significant tenderness or worsening of abdominal pains  Swelling of the abdomen that is new, acute  Fever of 100F or higher   For urgent or emergent issues, a gastroenterologist can be reached at any hour by calling [phone].   DIET:  We do recommend a small meal at first, but then you may proceed to your regular diet.   Drink plenty of fluids but you should avoid alcoholic beverages for 24 hours.  ACTIVITY:  You should plan to take it easy for the rest of today and you should NOT DRIVE or use heavy machinery until tomorrow (because of the sedation medicines used during the test).    FOLLOW UP: Our staff will call the number listed on your records 48-72 hours following your procedure to check on you and address any questions or concerns that you may have regarding the information given to you following your procedure. If we do not reach you, we will leave a message.  We will attempt to reach you two times.  During this call, we will ask if you have developed any symptoms of COVID 19. If you develop any symptoms (ie: fever, flu-like symptoms, shortness of breath, cough etc.) before then, please call [phone].  If you test positive for Covid 19 in the 2 weeks post procedure, please call and report this information to [place].    If any biopsies were taken you will be contacted by phone or by letter within the next 1-3 weeks.  Please call us at [phone] if you have not heard about the biopsies in 3 weeks.    SIGNATURES/CONFIDENTIALITY: You and/or your care partner have signed paperwork which will be entered into your electronic medical record.  These signatures attest to the fact that that the information above on your After Visit Summary has  been reviewed and is understood.  Full responsibility of the confidentiality of this discharge information lies with you and/or your care-partner.

## 2019-02-20 NOTE — Progress Notes (Signed)
Called to room to assist during endoscopic procedure.  Patient ID and intended procedure confirmed with present staff. Received instructions for my participation in the procedure from the performing physician.  

## 2019-02-20 NOTE — Progress Notes (Signed)
Report given to PACU, vss 

## 2019-02-22 ENCOUNTER — Telehealth: Payer: Self-pay | Admitting: *Deleted

## 2019-02-22 NOTE — Telephone Encounter (Signed)
  Follow up Call-  Call back number 02/20/2019  Post procedure Call Back phone  # [phone]  Permission to leave phone message Yes  Some recent data might be hidden     Patient questions:  Do you have a fever, pain , or abdominal swelling? No. Pain Score  0 *  Have you tolerated food without any problems? Yes.    Have you been able to return to your normal activities? Yes.    Do you have any questions about your discharge instructions: Diet   No. Medications  No. Follow up visit  No.  Do you have questions or concerns about your Care? No.  Actions: * If pain score is 4 or above: No action needed, pain <4.  1. Have you developed a fever since your procedure? no  2.   Have you had an respiratory symptoms (SOB or cough) since your procedure? no  3.   Have you tested positive for COVID 19 since your procedure no  4.   Have you had any family members/close contacts diagnosed with the COVID 19 since your procedure?  no   If yes to any of these questions please route to Joylene John, RN and Alphonsa Gin, Therapist, sports.

## 2019-02-27 ENCOUNTER — Encounter: Payer: Self-pay | Admitting: Internal Medicine

## 2019-03-20 DIAGNOSIS — E7849 Other hyperlipidemia: Secondary | ICD-10-CM | POA: Diagnosis not present

## 2019-03-20 DIAGNOSIS — Z125 Encounter for screening for malignant neoplasm of prostate: Secondary | ICD-10-CM | POA: Diagnosis not present

## 2019-03-20 DIAGNOSIS — R7301 Impaired fasting glucose: Secondary | ICD-10-CM | POA: Diagnosis not present

## 2019-04-04 DIAGNOSIS — Z0000 Encounter for general adult medical examination without abnormal findings: Secondary | ICD-10-CM | POA: Diagnosis not present

## 2019-04-04 DIAGNOSIS — E785 Hyperlipidemia, unspecified: Secondary | ICD-10-CM | POA: Diagnosis not present

## 2019-04-04 DIAGNOSIS — E298 Other testicular dysfunction: Secondary | ICD-10-CM | POA: Diagnosis not present

## 2019-04-04 DIAGNOSIS — Z1331 Encounter for screening for depression: Secondary | ICD-10-CM | POA: Diagnosis not present

## 2019-04-04 DIAGNOSIS — E119 Type 2 diabetes mellitus without complications: Secondary | ICD-10-CM | POA: Diagnosis not present

## 2019-04-04 DIAGNOSIS — E669 Obesity, unspecified: Secondary | ICD-10-CM | POA: Diagnosis not present

## 2019-04-06 ENCOUNTER — Other Ambulatory Visit: Payer: Self-pay | Admitting: Internal Medicine

## 2019-04-06 DIAGNOSIS — E785 Hyperlipidemia, unspecified: Secondary | ICD-10-CM

## 2019-04-26 ENCOUNTER — Other Ambulatory Visit: Payer: BLUE CROSS/BLUE SHIELD

## 2019-05-03 ENCOUNTER — Other Ambulatory Visit: Payer: BC Managed Care – PPO

## 2019-05-10 ENCOUNTER — Other Ambulatory Visit: Payer: BC Managed Care – PPO

## 2019-05-12 DIAGNOSIS — R109 Unspecified abdominal pain: Secondary | ICD-10-CM | POA: Diagnosis not present

## 2019-05-12 DIAGNOSIS — E119 Type 2 diabetes mellitus without complications: Secondary | ICD-10-CM | POA: Diagnosis not present

## 2019-05-19 ENCOUNTER — Other Ambulatory Visit: Payer: BC Managed Care – PPO

## 2019-05-24 ENCOUNTER — Other Ambulatory Visit: Payer: BC Managed Care – PPO

## 2019-05-26 ENCOUNTER — Ambulatory Visit
Admission: RE | Admit: 2019-05-26 | Discharge: 2019-05-26 | Disposition: A | Discharge status: Home / Self Care | Payer: No Typology Code available for payment source | Source: Ambulatory Visit | Attending: Internal Medicine | Admitting: Internal Medicine

## 2019-05-26 DIAGNOSIS — E785 Hyperlipidemia, unspecified: Secondary | ICD-10-CM | POA: Diagnosis not present

## 2020-02-13 DIAGNOSIS — E785 Hyperlipidemia, unspecified: Secondary | ICD-10-CM | POA: Diagnosis not present

## 2020-02-13 DIAGNOSIS — E119 Type 2 diabetes mellitus without complications: Secondary | ICD-10-CM | POA: Diagnosis not present

## 2020-02-13 DIAGNOSIS — K439 Ventral hernia without obstruction or gangrene: Secondary | ICD-10-CM | POA: Diagnosis not present

## 2020-02-16 DIAGNOSIS — K439 Ventral hernia without obstruction or gangrene: Secondary | ICD-10-CM | POA: Diagnosis not present

## 2020-02-16 DIAGNOSIS — K429 Umbilical hernia without obstruction or gangrene: Secondary | ICD-10-CM | POA: Diagnosis not present

## 2020-02-16 DIAGNOSIS — M6208 Separation of muscle (nontraumatic), other site: Secondary | ICD-10-CM | POA: Diagnosis not present

## 2020-08-06 DIAGNOSIS — E119 Type 2 diabetes mellitus without complications: Secondary | ICD-10-CM | POA: Diagnosis not present

## 2020-08-06 DIAGNOSIS — H04123 Dry eye syndrome of bilateral lacrimal glands: Secondary | ICD-10-CM | POA: Diagnosis not present

## 2020-08-06 DIAGNOSIS — Z125 Encounter for screening for malignant neoplasm of prostate: Secondary | ICD-10-CM | POA: Diagnosis not present

## 2020-08-06 DIAGNOSIS — E785 Hyperlipidemia, unspecified: Secondary | ICD-10-CM | POA: Diagnosis not present

## 2020-08-06 DIAGNOSIS — E298 Other testicular dysfunction: Secondary | ICD-10-CM | POA: Diagnosis not present

## 2020-08-06 DIAGNOSIS — H2513 Age-related nuclear cataract, bilateral: Secondary | ICD-10-CM | POA: Diagnosis not present

## 2020-10-23 DIAGNOSIS — E119 Type 2 diabetes mellitus without complications: Secondary | ICD-10-CM | POA: Diagnosis not present

## 2020-11-21 DIAGNOSIS — Z125 Encounter for screening for malignant neoplasm of prostate: Secondary | ICD-10-CM | POA: Diagnosis not present

## 2020-11-21 DIAGNOSIS — E119 Type 2 diabetes mellitus without complications: Secondary | ICD-10-CM | POA: Diagnosis not present

## 2020-11-21 DIAGNOSIS — E785 Hyperlipidemia, unspecified: Secondary | ICD-10-CM | POA: Diagnosis not present

## 2020-11-26 DIAGNOSIS — E298 Other testicular dysfunction: Secondary | ICD-10-CM | POA: Diagnosis not present

## 2020-11-27 DIAGNOSIS — Z1331 Encounter for screening for depression: Secondary | ICD-10-CM | POA: Diagnosis not present

## 2020-11-27 DIAGNOSIS — Z0000 Encounter for general adult medical examination without abnormal findings: Secondary | ICD-10-CM | POA: Diagnosis not present

## 2020-11-27 DIAGNOSIS — Z23 Encounter for immunization: Secondary | ICD-10-CM | POA: Diagnosis not present

## 2020-11-27 DIAGNOSIS — Z1389 Encounter for screening for other disorder: Secondary | ICD-10-CM | POA: Diagnosis not present

## 2020-11-27 DIAGNOSIS — E119 Type 2 diabetes mellitus without complications: Secondary | ICD-10-CM | POA: Diagnosis not present

## 2021-01-29 DIAGNOSIS — E119 Type 2 diabetes mellitus without complications: Secondary | ICD-10-CM | POA: Diagnosis not present

## 2021-03-17 DIAGNOSIS — M2011 Hallux valgus (acquired), right foot: Secondary | ICD-10-CM | POA: Diagnosis not present

## 2021-03-17 DIAGNOSIS — M19071 Primary osteoarthritis, right ankle and foot: Secondary | ICD-10-CM | POA: Diagnosis not present

## 2021-03-17 DIAGNOSIS — E119 Type 2 diabetes mellitus without complications: Secondary | ICD-10-CM | POA: Diagnosis not present

## 2021-03-17 DIAGNOSIS — M85471 Solitary bone cyst, right ankle and foot: Secondary | ICD-10-CM | POA: Diagnosis not present

## 2021-04-10 DIAGNOSIS — R300 Dysuria: Secondary | ICD-10-CM | POA: Diagnosis not present

## 2021-04-10 DIAGNOSIS — R972 Elevated prostate specific antigen [PSA]: Secondary | ICD-10-CM | POA: Diagnosis not present

## 2021-04-10 DIAGNOSIS — R030 Elevated blood-pressure reading, without diagnosis of hypertension: Secondary | ICD-10-CM | POA: Diagnosis not present

## 2021-04-10 DIAGNOSIS — E119 Type 2 diabetes mellitus without complications: Secondary | ICD-10-CM | POA: Diagnosis not present

## 2021-06-10 DIAGNOSIS — E119 Type 2 diabetes mellitus without complications: Secondary | ICD-10-CM | POA: Diagnosis not present

## 2021-08-05 DIAGNOSIS — B029 Zoster without complications: Secondary | ICD-10-CM | POA: Diagnosis not present

## 2021-08-21 DIAGNOSIS — E119 Type 2 diabetes mellitus without complications: Secondary | ICD-10-CM | POA: Diagnosis not present

## 2021-10-08 DIAGNOSIS — H04123 Dry eye syndrome of bilateral lacrimal glands: Secondary | ICD-10-CM | POA: Diagnosis not present

## 2021-10-08 DIAGNOSIS — E119 Type 2 diabetes mellitus without complications: Secondary | ICD-10-CM | POA: Diagnosis not present

## 2021-10-08 DIAGNOSIS — H2513 Age-related nuclear cataract, bilateral: Secondary | ICD-10-CM | POA: Diagnosis not present

## 2021-11-13 DIAGNOSIS — E119 Type 2 diabetes mellitus without complications: Secondary | ICD-10-CM | POA: Diagnosis not present

## 2021-12-04 DIAGNOSIS — E119 Type 2 diabetes mellitus without complications: Secondary | ICD-10-CM | POA: Diagnosis not present

## 2021-12-04 DIAGNOSIS — E785 Hyperlipidemia, unspecified: Secondary | ICD-10-CM | POA: Diagnosis not present

## 2021-12-04 DIAGNOSIS — Z125 Encounter for screening for malignant neoplasm of prostate: Secondary | ICD-10-CM | POA: Diagnosis not present

## 2021-12-11 DIAGNOSIS — E119 Type 2 diabetes mellitus without complications: Secondary | ICD-10-CM | POA: Diagnosis not present

## 2021-12-11 DIAGNOSIS — Z1339 Encounter for screening examination for other mental health and behavioral disorders: Secondary | ICD-10-CM | POA: Diagnosis not present

## 2021-12-11 DIAGNOSIS — R82998 Other abnormal findings in urine: Secondary | ICD-10-CM | POA: Diagnosis not present

## 2021-12-11 DIAGNOSIS — Z1331 Encounter for screening for depression: Secondary | ICD-10-CM | POA: Diagnosis not present

## 2021-12-11 DIAGNOSIS — Z0000 Encounter for general adult medical examination without abnormal findings: Secondary | ICD-10-CM | POA: Diagnosis not present

## 2021-12-29 ENCOUNTER — Other Ambulatory Visit (HOSPITAL_BASED_OUTPATIENT_CLINIC_OR_DEPARTMENT_OTHER): Payer: Self-pay

## 2021-12-29 MED ORDER — OZEMPIC (2 MG/DOSE) 8 MG/3ML ~~LOC~~ SOPN
2.0000 mg | PEN_INJECTOR | SUBCUTANEOUS | 6 refills | Status: DC
  Filled 2021-12-29: qty 9, 84d supply, fill #0
  Filled 2022-04-01: qty 3, 28d supply, fill #1
  Filled 2022-04-24: qty 3, 28d supply, fill #2
  Filled 2022-05-22: qty 3, 28d supply, fill #3
  Filled 2022-06-19: qty 3, 28d supply, fill #4

## 2021-12-30 ENCOUNTER — Other Ambulatory Visit (HOSPITAL_BASED_OUTPATIENT_CLINIC_OR_DEPARTMENT_OTHER): Payer: Self-pay

## 2022-01-18 IMAGING — CT CT CARDIAC CORONARY ARTERY CALCIUM SCORE
3 series · 14 of 20 positions shown, 16 images · non-contrast
Comparison: None.

CLINICAL DATA: 53-year-old male. African American. Hyperlipidemia.

EXAM:
CT CARDIAC CORONARY ARTERY CALCIUM SCORE
TECHNIQUE: Non-contrast imaging through the heart was performed using
prospective ECG gating. Image post processing was performed on an
independent workstation, allowing for quantitative analysis of the
heart and coronary arteries. Note that this exam targets the heart
and the chest was not imaged in its entirety.

[Series 2: calcium scoring 2.00 qr36 bestdiast 72% · axial · 0.34mm/px · z∈[+1715,+1799]mm · 4 of 70 slices shown]
[im 14/70  vessel]
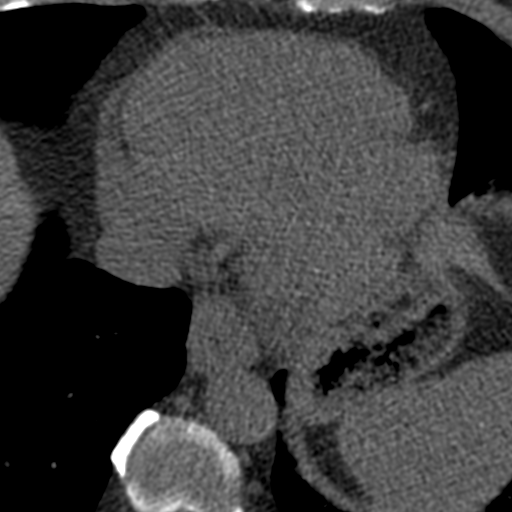
[im 28/70  vessel]
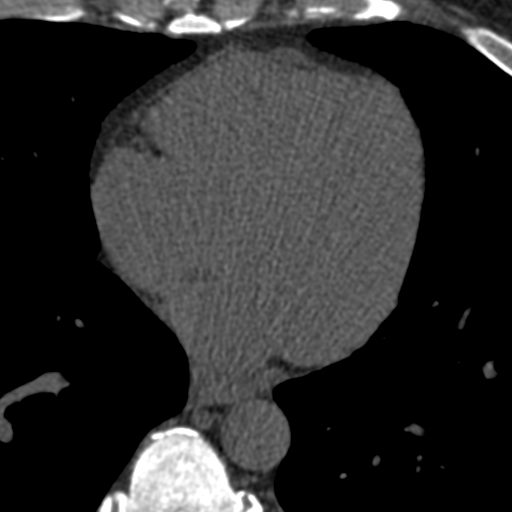
[im 42/70  vessel]
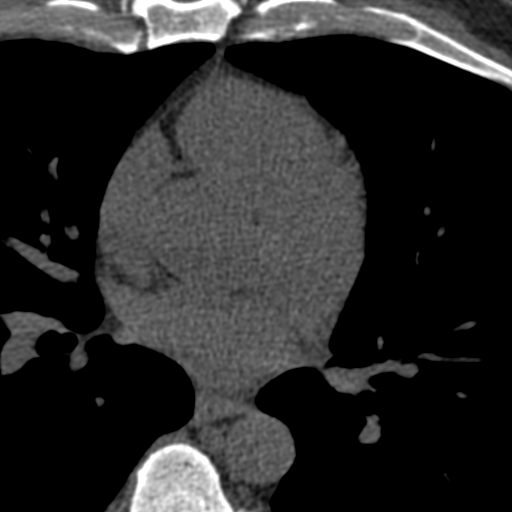
[im 56/70  vessel]
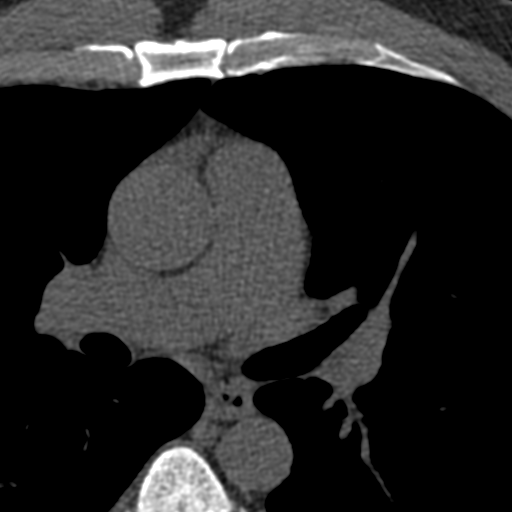

[Series 3: calcium scoring 2.00 br40 bestdiast 72% fov · axial · 0.60mm/px · z∈[+1712,+1804]mm · 5 of 70 slices shown, 7 images]
[im 12/70  vessel]
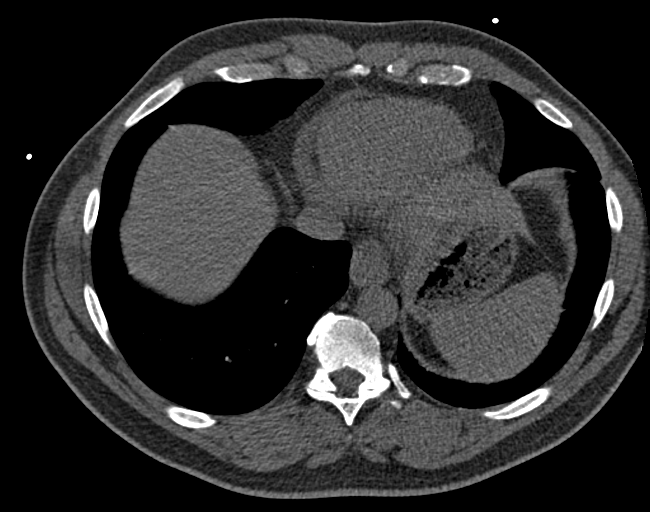
[im 12/70  lung]
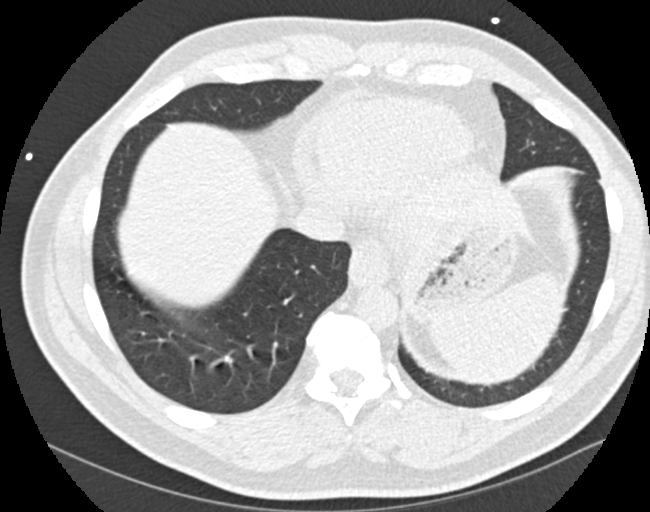
[im 24/70  vessel]
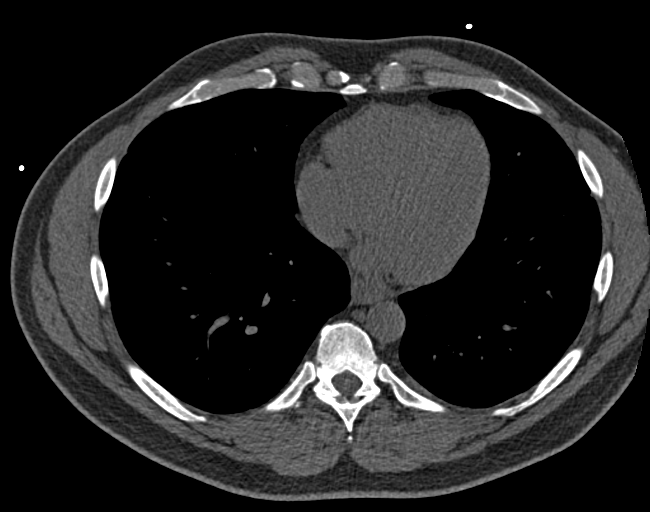
[im 35/70  vessel]
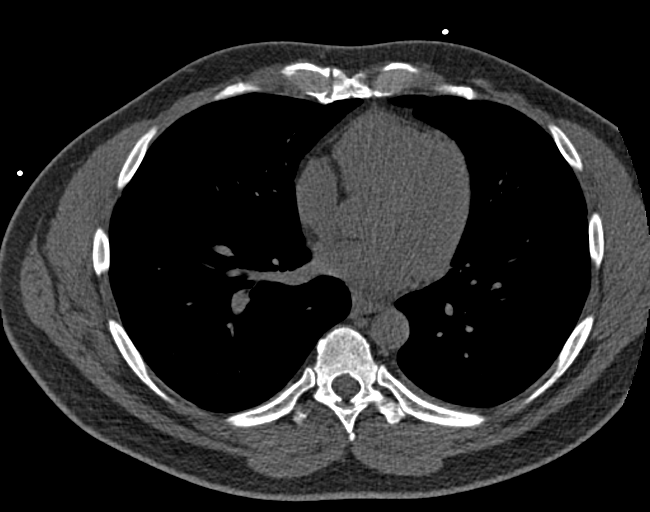
[im 47/70  vessel]
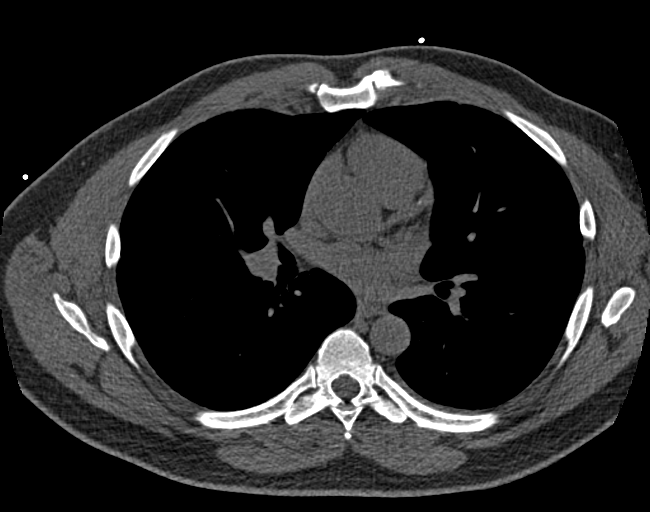
[im 58/70  vessel]
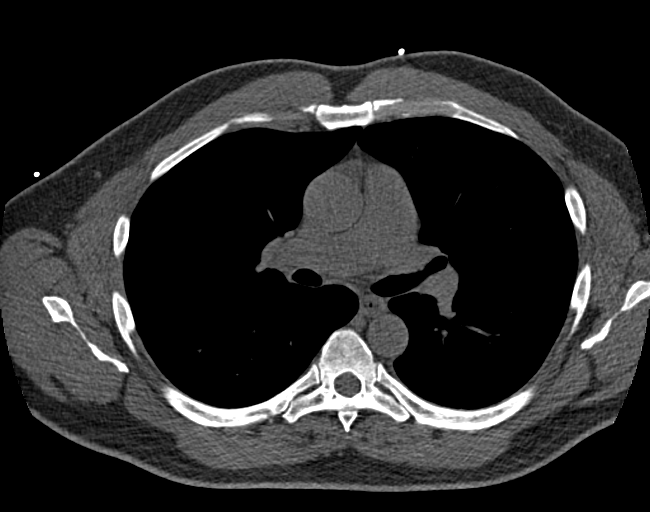
[im 58/70  lung]
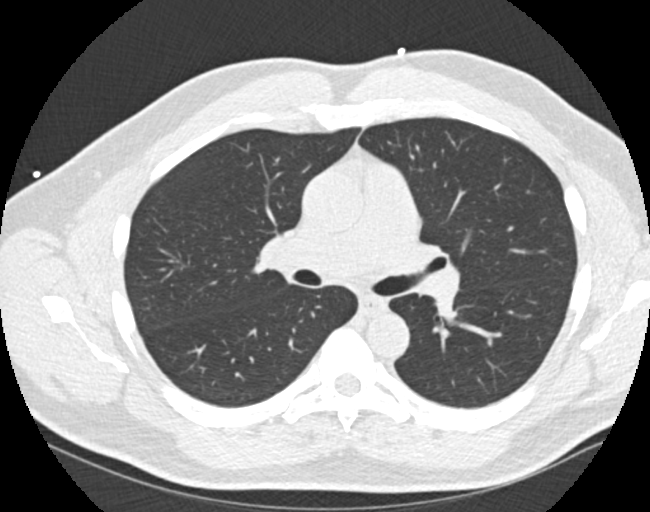

[Series 9: calcium scoring 2.00 br60 bestdiast 72% fov · axial · 0.62mm/px · z∈[+1713,+1805]mm · 5 of 70 slices shown]
[im 12/70  vessel]
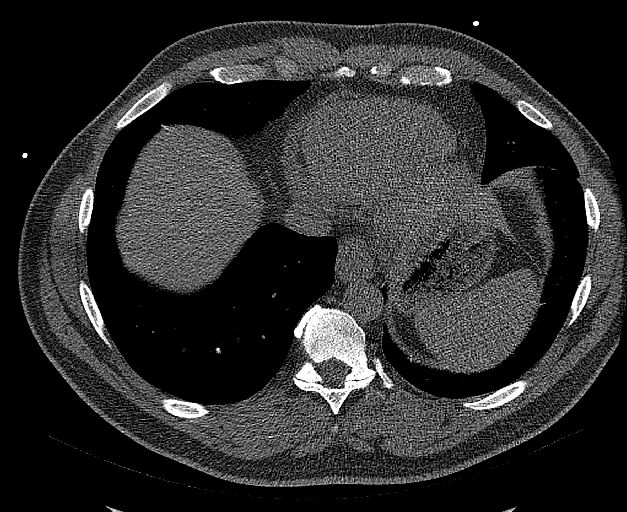
[im 24/70  vessel]
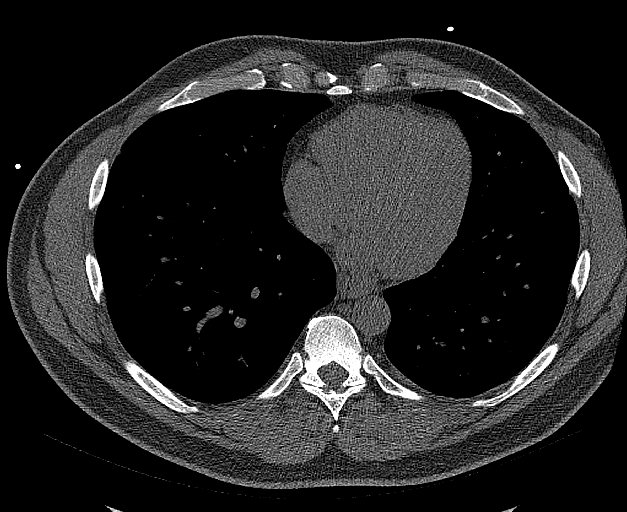
[im 35/70  vessel]
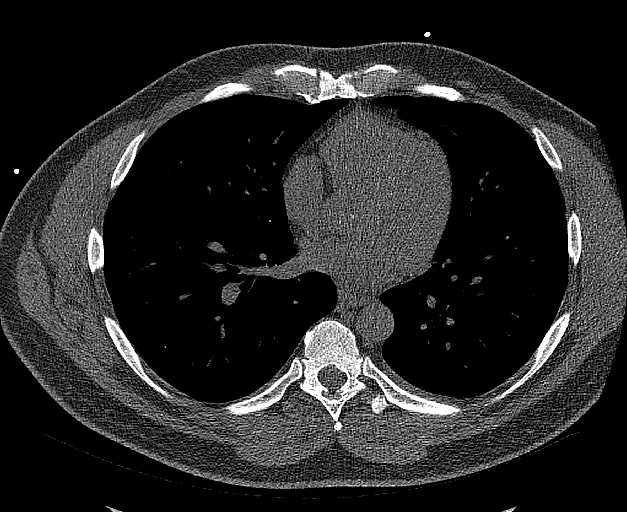
[im 47/70  vessel]
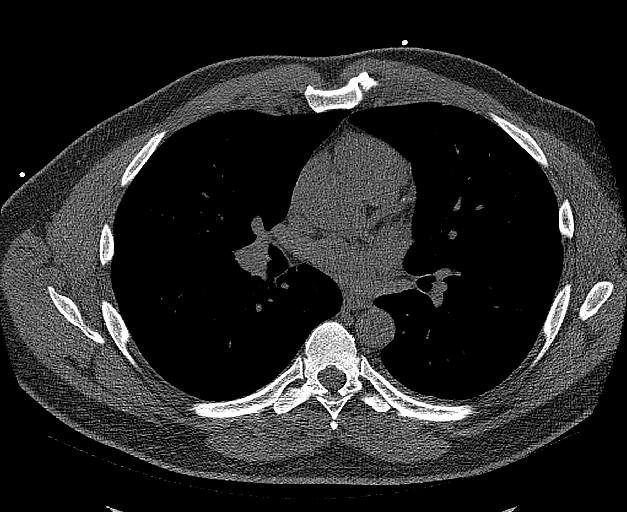
[im 58/70  vessel]
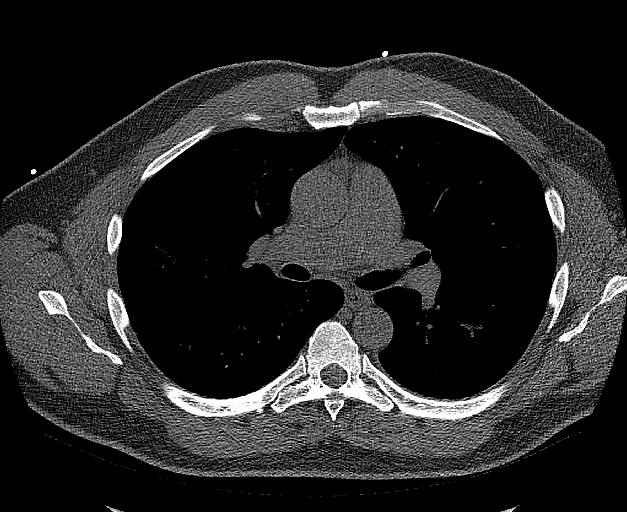

[14 of 20 positions shown; findings below may reference images not displayed]

FINDINGS: Technical quality: Good

CORONARY CALCIUM SCORES:

Left Main: No coronary artery calcification

LAD:

LCx: No coronary calcification

RCA: No coronary calcification

CORONARY CALCIUM

Total Agatston Score:

[HOSPITAL] percentile: 71

Ascending aorta (normal <  40 mm): 35 mm

EXTRACARDIAC FINDINGS:

Limited view of the lung parenchyma demonstrates no suspicious
nodularity. Airways are normal.

Limited view of the mediastinum demonstrates no adenopathy.
Esophagus normal.

Limited view of the upper abdomen unremarkable.

Limited view of the skeleton and chest wall is unremarkable.
IMPRESSION: 1. LAD coronary artery calcification.

2. Total Agatston Score:

3. MESA age and sex matched database percentile: 71

## 2022-03-17 DIAGNOSIS — E119 Type 2 diabetes mellitus without complications: Secondary | ICD-10-CM | POA: Diagnosis not present

## 2022-04-01 ENCOUNTER — Other Ambulatory Visit (HOSPITAL_BASED_OUTPATIENT_CLINIC_OR_DEPARTMENT_OTHER): Payer: Self-pay

## 2022-04-27 ENCOUNTER — Other Ambulatory Visit (HOSPITAL_BASED_OUTPATIENT_CLINIC_OR_DEPARTMENT_OTHER): Payer: Self-pay

## 2022-05-22 ENCOUNTER — Other Ambulatory Visit (HOSPITAL_BASED_OUTPATIENT_CLINIC_OR_DEPARTMENT_OTHER): Payer: Self-pay

## 2022-06-18 DIAGNOSIS — E119 Type 2 diabetes mellitus without complications: Secondary | ICD-10-CM | POA: Diagnosis not present

## 2022-07-27 ENCOUNTER — Other Ambulatory Visit (HOSPITAL_BASED_OUTPATIENT_CLINIC_OR_DEPARTMENT_OTHER): Payer: Self-pay

## 2022-07-27 MED ORDER — OZEMPIC (2 MG/DOSE) 8 MG/3ML ~~LOC~~ SOPN
2.0000 mg | PEN_INJECTOR | SUBCUTANEOUS | 6 refills | Status: DC
  Filled 2022-07-27: qty 3, 28d supply, fill #0
  Filled 2022-08-28: qty 3, 28d supply, fill #1
  Filled 2022-10-07: qty 3, 28d supply, fill #2
  Filled 2022-11-04 – 2022-11-12 (×2): qty 3, 28d supply, fill #3

## 2022-09-15 DIAGNOSIS — E119 Type 2 diabetes mellitus without complications: Secondary | ICD-10-CM | POA: Diagnosis not present

## 2022-10-07 ENCOUNTER — Other Ambulatory Visit: Payer: Self-pay

## 2022-10-20 DIAGNOSIS — H2513 Age-related nuclear cataract, bilateral: Secondary | ICD-10-CM | POA: Diagnosis not present

## 2022-10-20 DIAGNOSIS — H04123 Dry eye syndrome of bilateral lacrimal glands: Secondary | ICD-10-CM | POA: Diagnosis not present

## 2022-10-20 DIAGNOSIS — E119 Type 2 diabetes mellitus without complications: Secondary | ICD-10-CM | POA: Diagnosis not present

## 2022-11-04 ENCOUNTER — Other Ambulatory Visit (HOSPITAL_COMMUNITY): Payer: Self-pay

## 2022-11-12 ENCOUNTER — Other Ambulatory Visit (HOSPITAL_BASED_OUTPATIENT_CLINIC_OR_DEPARTMENT_OTHER): Payer: Self-pay

## 2023-01-06 DIAGNOSIS — E119 Type 2 diabetes mellitus without complications: Secondary | ICD-10-CM | POA: Diagnosis not present

## 2023-01-06 DIAGNOSIS — E785 Hyperlipidemia, unspecified: Secondary | ICD-10-CM | POA: Diagnosis not present

## 2023-01-06 DIAGNOSIS — Z0000 Encounter for general adult medical examination without abnormal findings: Secondary | ICD-10-CM | POA: Diagnosis not present

## 2023-01-06 DIAGNOSIS — E298 Other testicular dysfunction: Secondary | ICD-10-CM | POA: Diagnosis not present

## 2023-01-27 DIAGNOSIS — R82998 Other abnormal findings in urine: Secondary | ICD-10-CM | POA: Diagnosis not present

## 2023-01-27 DIAGNOSIS — Z1339 Encounter for screening examination for other mental health and behavioral disorders: Secondary | ICD-10-CM | POA: Diagnosis not present

## 2023-01-27 DIAGNOSIS — E119 Type 2 diabetes mellitus without complications: Secondary | ICD-10-CM | POA: Diagnosis not present

## 2023-01-27 DIAGNOSIS — Z0000 Encounter for general adult medical examination without abnormal findings: Secondary | ICD-10-CM | POA: Diagnosis not present

## 2023-01-27 DIAGNOSIS — Z1331 Encounter for screening for depression: Secondary | ICD-10-CM | POA: Diagnosis not present

## 2023-02-22 ENCOUNTER — Other Ambulatory Visit (INDEPENDENT_AMBULATORY_CARE_PROVIDER_SITE_OTHER): Payer: Federal, State, Local not specified - PPO

## 2023-02-22 ENCOUNTER — Ambulatory Visit (INDEPENDENT_AMBULATORY_CARE_PROVIDER_SITE_OTHER): Payer: Self-pay | Admitting: Orthopaedic Surgery

## 2023-02-22 DIAGNOSIS — G8929 Other chronic pain: Secondary | ICD-10-CM | POA: Diagnosis not present

## 2023-02-22 DIAGNOSIS — M1711 Unilateral primary osteoarthritis, right knee: Secondary | ICD-10-CM

## 2023-02-22 DIAGNOSIS — M25561 Pain in right knee: Secondary | ICD-10-CM | POA: Diagnosis not present

## 2023-02-22 DIAGNOSIS — M25562 Pain in left knee: Secondary | ICD-10-CM

## 2023-02-22 DIAGNOSIS — M1712 Unilateral primary osteoarthritis, left knee: Secondary | ICD-10-CM | POA: Diagnosis not present

## 2023-02-22 NOTE — Progress Notes (Signed)
Office Visit Note   Patient: Garrett Williams           Date of Birth: December 08, 1965           MRN: [id number] Visit Date: 02/22/2023              Requested by: [name], MD [address] [place],  [place] [address] PCP: [name], MD   Assessment & Plan: Visit Diagnoses:  1. Chronic pain of left knee   2. Chronic pain of right knee   3. Unilateral primary osteoarthritis, left knee   4. Unilateral primary osteoarthritis, right knee     Plan: We spoke in length in detail about his knees.  The definitive surgical treatment for the left knee would be a knee replacement.  However we need to stay conservative and I am going to have him work on quad strengthening exercises.  He is also a candidate for viscosupplementation with hyaluronic acid.  I would not recommend any further steroid injections in his knees given the detrimental effect this is having on his blood glucose and the fact that his blood glucose has been running high recently.  I explained the rationale behind hyaluronic acid injections and he agrees with this treatment plan.  Of note he is not obese either.  We will see him back hopefully in follow-up once we can order viscosupplementation for his knees.  This patient is diagnosed with osteoarthritis of the knee(s).    Radiographs show evidence of joint space narrowing, osteophytes, subchondral sclerosis and/or subchondral cysts.  This patient has knee pain which interferes with functional and activities of daily living.    This patient has experienced inadequate response, adverse effects and/or intolerance with conservative treatments such as acetaminophen, NSAIDS, topical creams, physical therapy or regular exercise, knee bracing and/or weight loss.   This patient has experienced inadequate response or has a contraindication to intra articular steroid injections for at least 3 months.   This patient is not scheduled to have a total knee replacement within 6  months of starting treatment with viscosupplementation.   Follow-Up Instructions: We will order hyaluronic acid for both knees.  He will be contacted when that is been approved through insurance and a follow-up appointment can be made.  Orders:  Orders Placed This Encounter  Procedures   XR Knee 1-2 Views Left   No orders of the defined types were placed in this encounter.     Procedures: No procedures performed   Clinical Data: No additional findings.   Subjective: Chief Complaint  Patient presents with   Left Knee - Pain  The patient is a very pleasant 57 year old veteran who was referred from [name] of [facility] to evaluate and treat left knee pain.  He has actually had a knee arthroscopy in 2011 on his left knee.  When he was in the [employer] they also provided steroid injections in his knees and this caused a significant elevation of his blood glucose.  He is a diabetic and he reports his hemoglobin A1c will has been over 7 recently and his blood glucose has been running high.  Both knees hurt on a daily basis with no significant locking catching.  The left knee is much worse than the right knee.  It hurts more with activities daily living and going up and down stairs.  He was a runner for many years as well.  Sometimes the pain does wake him up at night.  He does get occasional swelling of the left knee.  HPI  Review of Systems There is currently listed no fever, chills, nausea, vomiting  Objective: Vital Signs: There were no vitals taken for this visit.  Physical Exam He is alert and oriented x 3 and in no acute distress Ortho Exam Examination of both of his knees shows significant varus malalignment of the left knee and only mild varus alignment of the right knee.  Both knees have some patellofemoral crepitation which is much worse on the left than the right.  Both knees feel ligamentously stable with good range of motion. Specialty Comments:   No specialty comments available.  Imaging: XR Knee 1-2 Views Left  Result Date: 02/22/2023 3 views of the left knee also shows the right knee on the AP view standing.  Shows varus malalignment of both knees which is much worse on the left than the right.  There is also significant medial joint space narrowing on the left knee.  The lateral view of the left knee shows osteophytes of the patellofemoral joint and medial joint space narrowing.    PMFS History: Patient Active Problem List   Diagnosis Date Noted   Family history of colon cancer 04/18/2013   Chest pain 10/21/2011   Palpitations 10/21/2011   Past Medical History:  Diagnosis Date   Arthritis    KNEE   Diabetes mellitus without complication (HCC)    type 2   Gallstones    GERD (gastroesophageal reflux disease)    Hyperlipidemia    no meds    Family History  Problem Relation Age of Onset   Diabetes type II Mother    Prostate cancer Father    Hypertension Father    Colon cancer Father 48   Colon cancer Maternal Uncle    Coronary artery disease Neg Hx    Rectal cancer Neg Hx    Stomach cancer Neg Hx    Esophageal cancer Neg Hx     Past Surgical History:  Procedure Laterality Date   COLONOSCOPY  04/2013   poyps   KNEE ARTHROSCOPY Left 07/2010   WISDOM TOOTH EXTRACTION     Social History   Occupational History   Occupation: Conservation officer, nature: [employer]  Tobacco Use   Smoking status: Never   Smokeless tobacco: Never  Vaping Use   Vaping status: Never Used  Substance and Sexual Activity   Alcohol use: Yes    Alcohol/week: 2.0 standard drinks of alcohol    Types: 2 Standard drinks or equivalent per week   Drug use: No   Sexual activity: Yes

## 2023-03-16 DIAGNOSIS — R972 Elevated prostate specific antigen [PSA]: Secondary | ICD-10-CM | POA: Diagnosis not present

## 2023-03-22 ENCOUNTER — Other Ambulatory Visit (HOSPITAL_BASED_OUTPATIENT_CLINIC_OR_DEPARTMENT_OTHER): Payer: Self-pay

## 2023-03-22 ENCOUNTER — Ambulatory Visit
Admission: RE | Admit: 2023-03-22 | Discharge: 2023-03-22 | Disposition: A | Discharge status: Home / Self Care | Payer: Federal, State, Local not specified - PPO | Source: Ambulatory Visit | Attending: Adult Health | Admitting: Adult Health

## 2023-03-22 ENCOUNTER — Encounter: Payer: Self-pay | Admitting: Adult Health

## 2023-03-22 ENCOUNTER — Other Ambulatory Visit: Payer: Self-pay | Admitting: Adult Health

## 2023-03-22 DIAGNOSIS — R109 Unspecified abdominal pain: Secondary | ICD-10-CM

## 2023-03-22 DIAGNOSIS — K429 Umbilical hernia without obstruction or gangrene: Secondary | ICD-10-CM | POA: Diagnosis not present

## 2023-03-22 DIAGNOSIS — K7689 Other specified diseases of liver: Secondary | ICD-10-CM | POA: Diagnosis not present

## 2023-03-22 DIAGNOSIS — R6883 Chills (without fever): Secondary | ICD-10-CM

## 2023-03-22 DIAGNOSIS — R1030 Lower abdominal pain, unspecified: Secondary | ICD-10-CM | POA: Diagnosis not present

## 2023-03-22 DIAGNOSIS — E119 Type 2 diabetes mellitus without complications: Secondary | ICD-10-CM | POA: Diagnosis not present

## 2023-03-22 DIAGNOSIS — R110 Nausea: Secondary | ICD-10-CM

## 2023-03-22 DIAGNOSIS — Z860101 Personal history of adenomatous and serrated colon polyps: Secondary | ICD-10-CM

## 2023-03-22 DIAGNOSIS — K8020 Calculus of gallbladder without cholecystitis without obstruction: Secondary | ICD-10-CM | POA: Diagnosis not present

## 2023-03-22 MED ORDER — OZEMPIC (2 MG/DOSE) 8 MG/3ML ~~LOC~~ SOPN
2.0000 mg | PEN_INJECTOR | SUBCUTANEOUS | 3 refills | Status: DC
  Filled 2023-03-22: qty 3, 28d supply, fill #0

## 2023-03-22 MED ORDER — IOPAMIDOL (ISOVUE-300) INJECTION 61%
500.0000 mL | Freq: Once | INTRAVENOUS | Status: AC | PRN
  Administered 2023-03-22: 100 mL via INTRAVENOUS

## 2023-03-25 ENCOUNTER — Other Ambulatory Visit: Payer: Self-pay | Admitting: Internal Medicine

## 2023-03-25 DIAGNOSIS — D1803 Hemangioma of intra-abdominal structures: Secondary | ICD-10-CM

## 2023-03-26 ENCOUNTER — Other Ambulatory Visit: Payer: Federal, State, Local not specified - PPO

## 2023-03-29 ENCOUNTER — Ambulatory Visit
Admission: RE | Admit: 2023-03-29 | Discharge: 2023-03-29 | Disposition: A | Discharge status: Home / Self Care | Payer: Federal, State, Local not specified - PPO | Source: Ambulatory Visit | Attending: Internal Medicine | Admitting: Internal Medicine

## 2023-03-29 DIAGNOSIS — D1803 Hemangioma of intra-abdominal structures: Secondary | ICD-10-CM

## 2023-03-29 DIAGNOSIS — K8020 Calculus of gallbladder without cholecystitis without obstruction: Secondary | ICD-10-CM | POA: Diagnosis not present

## 2023-03-29 DIAGNOSIS — K7689 Other specified diseases of liver: Secondary | ICD-10-CM | POA: Diagnosis not present

## 2023-03-29 DIAGNOSIS — K449 Diaphragmatic hernia without obstruction or gangrene: Secondary | ICD-10-CM | POA: Diagnosis not present

## 2023-03-29 MED ORDER — GADOPICLENOL 0.5 MMOL/ML IV SOLN
10.0000 mL | Freq: Once | INTRAVENOUS | Status: AC | PRN
  Administered 2023-03-29: 10 mL via INTRAVENOUS

## 2023-04-06 DIAGNOSIS — R030 Elevated blood-pressure reading, without diagnosis of hypertension: Secondary | ICD-10-CM | POA: Diagnosis not present

## 2023-04-06 DIAGNOSIS — E119 Type 2 diabetes mellitus without complications: Secondary | ICD-10-CM | POA: Diagnosis not present

## 2023-07-22 DIAGNOSIS — E119 Type 2 diabetes mellitus without complications: Secondary | ICD-10-CM | POA: Diagnosis not present

## 2023-11-09 DIAGNOSIS — E119 Type 2 diabetes mellitus without complications: Secondary | ICD-10-CM | POA: Diagnosis not present

## 2023-12-07 DIAGNOSIS — M25561 Pain in right knee: Secondary | ICD-10-CM | POA: Diagnosis not present

## 2023-12-14 ENCOUNTER — Other Ambulatory Visit (HOSPITAL_BASED_OUTPATIENT_CLINIC_OR_DEPARTMENT_OTHER): Payer: Self-pay

## 2023-12-14 MED ORDER — MOUNJARO 5 MG/0.5ML ~~LOC~~ SOAJ
5.0000 mg | SUBCUTANEOUS | 11 refills | Status: AC
  Filled 2023-12-14: qty 2, 28d supply, fill #0
  Filled 2024-01-13: qty 2, 28d supply, fill #1
  Filled 2024-02-05: qty 2, 28d supply, fill #2

## 2023-12-16 ENCOUNTER — Other Ambulatory Visit (HOSPITAL_BASED_OUTPATIENT_CLINIC_OR_DEPARTMENT_OTHER): Payer: Self-pay

## 2024-01-25 ENCOUNTER — Other Ambulatory Visit (HOSPITAL_COMMUNITY): Payer: Self-pay

## 2024-01-28 ENCOUNTER — Encounter: Payer: Self-pay | Admitting: Internal Medicine

## 2024-01-31 ENCOUNTER — Encounter: Payer: Self-pay | Admitting: Radiology

## 2024-02-07 ENCOUNTER — Other Ambulatory Visit (HOSPITAL_BASED_OUTPATIENT_CLINIC_OR_DEPARTMENT_OTHER): Payer: Self-pay

## 2024-02-17 ENCOUNTER — Ambulatory Visit (AMBULATORY_SURGERY_CENTER)

## 2024-02-17 VITALS — Ht 72.0 in | Wt 225.0 lb

## 2024-02-17 DIAGNOSIS — Z800 Family history of malignant neoplasm of digestive organs: Secondary | ICD-10-CM

## 2024-02-17 DIAGNOSIS — Z860100 Personal history of colon polyps, unspecified: Secondary | ICD-10-CM

## 2024-02-17 MED ORDER — NA SULFATE-K SULFATE-MG SULF 17.5-3.13-1.6 GM/177ML PO SOLN: 1.0000 | Freq: Once | ORAL | 0 refills | Status: AC

## 2024-02-17 NOTE — Progress Notes (Signed)
 No egg or soy allergy known to patient  No issues known to pt with past sedation with any surgeries or procedures Patient denies ever being told they had issues or difficulty with intubation  No FH of Malignant Hyperthermia  Pt is on diet pills, Takes Mounjaro  and hold instructions provided  Pt is not on  home 02  Pt is not on blood thinners  Pt has intermittent issues with constipation  No A fib or A flutter Have any cardiac testing pending--No Pt can ambulate  Pt denies use of chewing tobacco Discussed diabetic I weight loss medication holds Discussed NSAID holds Checked BMI Pt instructed to use Single[employer]com or GoodRx for a price reduction on prep  Patient's chart reviewed by Norleen Schillings CNRA prior to previsit and patient appropriate for the [facility].  Pre visit completed and red dot placed by patient's name on their procedure day (on provider's schedule).

## 2024-02-21 ENCOUNTER — Other Ambulatory Visit: Payer: Self-pay | Admitting: Medical Genetics

## 2024-02-21 DIAGNOSIS — E785 Hyperlipidemia, unspecified: Secondary | ICD-10-CM | POA: Diagnosis not present

## 2024-02-21 DIAGNOSIS — R972 Elevated prostate specific antigen [PSA]: Secondary | ICD-10-CM | POA: Diagnosis not present

## 2024-02-21 DIAGNOSIS — E119 Type 2 diabetes mellitus without complications: Secondary | ICD-10-CM | POA: Diagnosis not present

## 2024-02-29 ENCOUNTER — Other Ambulatory Visit (HOSPITAL_BASED_OUTPATIENT_CLINIC_OR_DEPARTMENT_OTHER): Payer: Self-pay

## 2024-02-29 DIAGNOSIS — R82998 Other abnormal findings in urine: Secondary | ICD-10-CM | POA: Diagnosis not present

## 2024-02-29 DIAGNOSIS — E119 Type 2 diabetes mellitus without complications: Secondary | ICD-10-CM | POA: Diagnosis not present

## 2024-02-29 DIAGNOSIS — Z23 Encounter for immunization: Secondary | ICD-10-CM | POA: Diagnosis not present

## 2024-02-29 DIAGNOSIS — Z0000 Encounter for general adult medical examination without abnormal findings: Secondary | ICD-10-CM | POA: Diagnosis not present

## 2024-02-29 DIAGNOSIS — Z1331 Encounter for screening for depression: Secondary | ICD-10-CM | POA: Diagnosis not present

## 2024-02-29 DIAGNOSIS — Z1339 Encounter for screening examination for other mental health and behavioral disorders: Secondary | ICD-10-CM | POA: Diagnosis not present

## 2024-02-29 MED ORDER — MOUNJARO 7.5 MG/0.5ML ~~LOC~~ SOAJ
7.5000 mg | SUBCUTANEOUS | 5 refills | Status: AC
  Filled 2024-02-29: qty 2, 28d supply, fill #0
  Filled 2024-04-03 – 2024-04-17 (×2): qty 2, 28d supply, fill #1

## 2024-03-01 ENCOUNTER — Other Ambulatory Visit (HOSPITAL_BASED_OUTPATIENT_CLINIC_OR_DEPARTMENT_OTHER): Payer: Self-pay

## 2024-03-01 MED ORDER — TESTOSTERONE 20.25 MG/1.25GM (1.62%) TD GEL
1.0000 | Freq: Every day | TRANSDERMAL | 5 refills | Status: AC
  Filled 2024-03-01: qty 37.5, 30d supply, fill #0

## 2024-03-02 ENCOUNTER — Other Ambulatory Visit: Payer: Self-pay

## 2024-03-02 ENCOUNTER — Telehealth: Payer: Self-pay | Admitting: Orthopaedic Surgery

## 2024-03-02 DIAGNOSIS — M1711 Unilateral primary osteoarthritis, right knee: Secondary | ICD-10-CM

## 2024-03-02 DIAGNOSIS — M1712 Unilateral primary osteoarthritis, left knee: Secondary | ICD-10-CM

## 2024-03-02 DIAGNOSIS — G8929 Other chronic pain: Secondary | ICD-10-CM

## 2024-03-02 NOTE — Telephone Encounter (Signed)
 Pt saw Dr Vernetta on 02/22/23 was to get gel injection auth. Pt never heard back from our office. Can we see if the gel inj can get approved

## 2024-03-08 DIAGNOSIS — K439 Ventral hernia without obstruction or gangrene: Secondary | ICD-10-CM | POA: Diagnosis not present

## 2024-03-09 ENCOUNTER — Encounter: Admitting: Internal Medicine

## 2024-03-09 ENCOUNTER — Other Ambulatory Visit (HOSPITAL_BASED_OUTPATIENT_CLINIC_OR_DEPARTMENT_OTHER): Payer: Self-pay

## 2024-03-10 ENCOUNTER — Other Ambulatory Visit (HOSPITAL_BASED_OUTPATIENT_CLINIC_OR_DEPARTMENT_OTHER): Payer: Self-pay

## 2024-03-11 ENCOUNTER — Other Ambulatory Visit (HOSPITAL_BASED_OUTPATIENT_CLINIC_OR_DEPARTMENT_OTHER): Payer: Self-pay

## 2024-03-13 ENCOUNTER — Other Ambulatory Visit (HOSPITAL_BASED_OUTPATIENT_CLINIC_OR_DEPARTMENT_OTHER): Payer: Self-pay

## 2024-03-17 NOTE — Telephone Encounter (Signed)
 Good afternoon Dr. Albertus,   Patient called requesting to reschedule colonoscopy for Monday 12/22. States he is unsure if he will be in town. Patient scheduled to 1/5.   Thank you

## 2024-03-20 ENCOUNTER — Encounter: Admitting: Internal Medicine

## 2024-04-03 ENCOUNTER — Encounter: Payer: Self-pay | Admitting: Internal Medicine

## 2024-04-03 ENCOUNTER — Ambulatory Visit: Admitting: Internal Medicine

## 2024-04-03 VITALS — BP 90/53 | HR 72 | Temp 97.7°F | Resp 13 | Ht 72.0 in | Wt 225.0 lb

## 2024-04-03 DIAGNOSIS — K621 Rectal polyp: Secondary | ICD-10-CM

## 2024-04-03 DIAGNOSIS — D128 Benign neoplasm of rectum: Secondary | ICD-10-CM

## 2024-04-03 DIAGNOSIS — K5730 Diverticulosis of large intestine without perforation or abscess without bleeding: Secondary | ICD-10-CM | POA: Diagnosis not present

## 2024-04-03 DIAGNOSIS — Z1211 Encounter for screening for malignant neoplasm of colon: Secondary | ICD-10-CM

## 2024-04-03 DIAGNOSIS — Z860101 Personal history of adenomatous and serrated colon polyps: Secondary | ICD-10-CM | POA: Diagnosis not present

## 2024-04-03 DIAGNOSIS — Z860100 Personal history of colon polyps, unspecified: Secondary | ICD-10-CM

## 2024-04-03 DIAGNOSIS — Z800 Family history of malignant neoplasm of digestive organs: Secondary | ICD-10-CM | POA: Diagnosis not present

## 2024-04-03 MED ORDER — SODIUM CHLORIDE 0.9 % IV SOLN: 500.0000 mL | Freq: Once | INTRAVENOUS | Status: DC

## 2024-04-03 NOTE — Progress Notes (Signed)
 "   GASTROENTEROLOGY PROCEDURE H&P NOTE   Primary Care Physician: Loreli Elsie JONETTA Mickey., MD    Reason for Procedure:  Personal history of colonic polyps and family history of colon cancer  Plan:    Colonoscopy  Patient is appropriate for endoscopic procedure(s) in the ambulatory ([facility]) setting.  The nature of the procedure, as well as the risks, benefits, and alternatives were carefully and thoroughly reviewed with the patient. Ample time for discussion and questions allowed.  All questions were answered. The patient understood, was satisfied, and agreed with the plan to proceed.    HPI: Garrett Williams is a 59 y.o. male who presents for surveillance colonoscopy.  Medical history as below.  Tolerated the prep.  No recent chest pain or shortness of breath.  No abdominal pain today.  Past Medical History:  Diagnosis Date   Arthritis    KNEE   Diabetes mellitus without complication (HCC)    type 2   Gallstones    GERD (gastroesophageal reflux disease)    Hyperlipidemia    no meds    Past Surgical History:  Procedure Laterality Date   COLONOSCOPY  04/2013   poyps   KNEE ARTHROSCOPY Left 07/2010   WISDOM TOOTH EXTRACTION      Prior to Admission medications  Medication Sig Start Date End Date Taking? Authorizing Provider  cholecalciferol (VITAMIN D) 1000 UNITS tablet Take 1,000 Units by mouth daily.   Yes [provider]  Multiple Vitamin (MULTIVITAMIN) tablet Take 1 tablet by mouth daily.   Yes [provider]  rosuvastatin (CRESTOR) 20 MG tablet Take 20 mg by mouth daily. 04/05/21  Yes [provider]  SYNJARDY XR 25-1000 MG TB24 Take 1 tablet by mouth daily at 2 PM.   Yes [provider]  Ascorbic Acid (VITAMIN C) 100 MG tablet Take 100 mg by mouth daily. Patient not taking: Reported on 02/17/2024    [provider]  ASPIRIN 81 PO Take 1 tablet by mouth daily.    [provider]  pantoprazole (PROTONIX) 40 MG tablet Take 40  mg by mouth daily. 01/13/19   [provider]  Testosterone  20.25 MG/1.25GM (1.62%) GEL Apply 1 packet to shoulder and upper arms once daily in the morning. Patient not taking: Reported on 04/03/2024 02/29/24     tirzepatide  (MOUNJARO ) 5 MG/0.5ML Pen Inject 5 mg into the skin once a week. Patient not taking: Reported on 04/03/2024 12/14/23     tirzepatide  (MOUNJARO ) 7.5 MG/0.5ML Pen Inject 7.5 mg into the skin once a week. 02/29/24     tirzepatide  (MOUNJARO ) 7.5 MG/0.5ML Pen Inject 7.5 mg into the skin. Patient not taking: Reported on 04/03/2024 02/29/24   [provider]    Current Outpatient Medications  Medication Sig Dispense Refill   cholecalciferol (VITAMIN D) 1000 UNITS tablet Take 1,000 Units by mouth daily.     Multiple Vitamin (MULTIVITAMIN) tablet Take 1 tablet by mouth daily.     rosuvastatin (CRESTOR) 20 MG tablet Take 20 mg by mouth daily.     SYNJARDY XR 25-1000 MG TB24 Take 1 tablet by mouth daily at 2 PM.     Ascorbic Acid (VITAMIN C) 100 MG tablet Take 100 mg by mouth daily. (Patient not taking: Reported on 02/17/2024)     ASPIRIN 81 PO Take 1 tablet by mouth daily.     pantoprazole (PROTONIX) 40 MG tablet Take 40 mg by mouth daily.     Testosterone  20.25 MG/1.25GM (1.62%) GEL Apply 1 packet  to shoulder and upper arms once daily in the morning. (Patient not taking: Reported on 04/03/2024) 37.5 g 5   tirzepatide  (MOUNJARO ) 5 MG/0.5ML Pen Inject 5 mg into the skin once a week. (Patient not taking: Reported on 04/03/2024) 2 mL 11   tirzepatide  (MOUNJARO ) 7.5 MG/0.5ML Pen Inject 7.5 mg into the skin once a week. 2 mL 5   tirzepatide  (MOUNJARO ) 7.5 MG/0.5ML Pen Inject 7.5 mg into the skin. (Patient not taking: Reported on 04/03/2024)     Current Facility-Administered Medications  Medication Dose Route Frequency Provider Last Rate Last Admin   0.9 %  sodium chloride  infusion  500 mL Intravenous Once Zurri Rudden, Gordy HERO, MD        Allergies as of 04/03/2024   (No Known Allergies)     Family History  Problem Relation Age of Onset   Diabetes type II Mother    Prostate cancer Father    Hypertension Father    Colon cancer Father 67   Colon cancer Maternal Uncle    Coronary artery disease Neg Hx    Rectal cancer Neg Hx    Stomach cancer Neg Hx    Esophageal cancer Neg Hx     Social History   Socioeconomic History   Marital status: Married    Spouse name: Not on file   Number of children: 1   Years of education: Not on file   Highest education level: Not on file  Occupational History   Occupation: Conservation Officer, [employer]: [employer]  Tobacco Use   Smoking status: Never   Smokeless tobacco: Never  Vaping Use   Vaping status: Never Used  Substance and Sexual Activity   Alcohol  use: Yes    Alcohol /week: 2.0 standard drinks of alcohol     Types: 2 Standard drinks or equivalent per week   Drug use: No   Sexual activity: Yes  Other Topics Concern   Not on file  Social History Narrative   Married    Masters in [employer] and Business (on line)   Manufacturing Systems Engineer forces x 16 years    Non smoker occasional ETHOL  No drug use   Social Drivers of Health   Tobacco Use: Low Risk (04/03/2024)   Patient History    Smoking Tobacco Use: Never    Smokeless Tobacco Use: Never    Passive Exposure: Not on file  Financial Resource Strain: Not on file  Food Insecurity: Not on file  Transportation Needs: Not on file  Physical Activity: Not on file  Stress: Not on file  Social Connections: Not on file  Intimate Partner Violence: Not on file  Depression (EYV7-0): Not on file  Alcohol  Screen: Not on file  Housing: Unknown (03/08/2024)   Received from [facility]   Epic    Unable to Pay for Housing in the Last Year: Not on file    Number of Times Moved in the Last Year: Not on file    At any time in the past 12 months, were you homeless or living in a shelter (including now)?: No  Utilities: Not on file  Health Literacy: Not on  file    Physical Exam: Vital signs in last 24 hours: @BP  137/79   Pulse 74   Temp 97.7 F (36.5 C) (Skin)   Ht 6' (1.829 m)   Wt 225 lb (102.1 kg)   SpO2 100%   BMI 30.52 kg/m  GEN: NAD EYE: Sclerae anicteric ENT: MMM  CV: Non-tachycardic Pulm: CTA b/l GI: Soft, NT/ND NEURO:  Alert & Oriented x 3   Gordy Starch, MD [facility]  04/03/2024 2:43 PM  "

## 2024-04-03 NOTE — Op Note (Signed)
 [facility] Patient Name: Garrett Williams Procedure Date: 04/03/2024 2:45 PM MRN: [id number] Endoscopist: Gordy CHRISTELLA Starch , MD, [id number] Age: 59 Referring MD:  Date of Birth: 08/11/1965 Gender: Male Account #: [id number] Procedure:                Colonoscopy Indications:              High risk colon cancer surveillance: Personal                            history of sessile serrated and adenomatous polyps,                            Family history of colon cancer in a first-degree                            relative (father), Last colonoscopy: November 2020                            (TA x 1), Feb 2015 (SSP x 2) Medicines:                Propofol per Anesthesia Procedure:                Pre-Anesthesia Assessment:                           - Prior to the procedure, a History and Physical                            was performed, and patient medications and                            allergies were reviewed. The patient's tolerance of                            previous anesthesia was also reviewed. The risks                            and benefits of the procedure and the sedation                            options and risks were discussed with the patient.                            All questions were answered, and informed consent                            was obtained. Prior Anticoagulants: The patient has                            taken no anticoagulant or antiplatelet agents. ASA                            Grade Assessment: II - A patient with mild systemic  disease. After reviewing the risks and benefits,                            the patient was deemed in satisfactory condition to                            undergo the procedure.                           After obtaining informed consent, the colonoscope                            was passed under direct vision. Throughout the                            procedure, the patient's blood pressure,  pulse, and                            oxygen saturations were monitored continuously. The                            Olympus Scope SN: [id number] was introduced through                            the anus and advanced to the cecum, identified by                            appendiceal orifice and ileocecal valve. The                            colonoscopy was performed without difficulty. The                            patient tolerated the procedure well. The quality                            of the bowel preparation was good. The ileocecal                            valve, appendiceal orifice, and rectum were                            photographed. Scope In: 2:52:48 PM Scope Out: 3:07:39 PM Scope Withdrawal Time: 0 hours 8 minutes 52 seconds  Total Procedure Duration: 0 hours 14 minutes 51 seconds  Findings:                 The digital rectal exam was normal.                           A 4 mm polyp was found in the rectum. The polyp was                            sessile. The polyp was removed with a cold snare.  Resection and retrieval were complete.                           Multiple small-mouthed diverticula were found in                            the sigmoid colon, descending colon and ascending                            colon.                           The retroflexed view of the distal rectum and anal                            verge was normal and showed no anal or rectal                            abnormalities. Complications:            No immediate complications. Estimated Blood Loss:     Estimated blood loss: none. Impression:               - One 4 mm polyp in the rectum, removed with a cold                            snare. Resected and retrieved.                           - Mild diverticulosis in the sigmoid colon, in the                            descending colon and in the ascending colon.                           - The distal rectum and anal  verge are normal on                            retroflexion view. Recommendation:           - Patient has a contact number available for                            emergencies. The signs and symptoms of potential                            delayed complications were discussed with the                            patient. Return to normal activities tomorrow.                            Written discharge instructions were provided to the                            patient.                           -  Resume previous diet.                           - Continue present medications.                           - Await pathology results.                           - Repeat colonoscopy in 5 years for surveillance. Gordy CHRISTELLA Starch, MD 04/03/2024 3:11:20 PM This report has been signed electronically.

## 2024-04-03 NOTE — Progress Notes (Signed)
 Report to PACU, RN, vss, BBS= Clear.

## 2024-04-03 NOTE — Progress Notes (Signed)
 Called to room to assist during endoscopic procedure.  Patient ID and intended procedure confirmed with present staff. Received instructions for my participation in the procedure from the performing physician.

## 2024-04-03 NOTE — Patient Instructions (Signed)
-  Await pathology results -Handout on polyps provided  OU HAD AN ENDOSCOPIC PROCEDURE TODAY AT THE [facility]:   Refer to the procedure report that was given to you for any specific questions about what was found during the examination.  If the procedure report does not answer your questions, please call your gastroenterologist to clarify.  If you requested that your care partner not be given the details of your procedure findings, then the procedure report has been included in a sealed envelope for you to review at your convenience later.  YOU SHOULD EXPECT: Some feelings of bloating in the abdomen. Passage of more gas than usual.  Walking can help get rid of the air that was put into your GI tract during the procedure and reduce the bloating. If you had a lower endoscopy (such as a colonoscopy or flexible sigmoidoscopy) you may notice spotting of blood in your stool or on the toilet paper. If you underwent a bowel prep for your procedure, you may not have a normal bowel movement for a few days.  Please Note:  You might notice some irritation and congestion in your nose or some drainage.  This is from the oxygen used during your procedure.  There is no need for concern and it should clear up in a day or so.  SYMPTOMS TO REPORT IMMEDIATELY:  Following lower endoscopy (colonoscopy or flexible sigmoidoscopy):  Excessive amounts of blood in the stool  Significant tenderness or worsening of abdominal pains  Swelling of the abdomen that is new, acute  Fever of 100F or higher  For urgent or emergent issues, a gastroenterologist can be reached at any hour by calling [phone]. Do not use MyChart messaging for urgent concerns.    DIET:  We do recommend a small meal at first, but then you may proceed to your regular diet.  Drink plenty of fluids but you should avoid alcoholic beverages for 24 hours.  ACTIVITY:  You should plan to take it easy for the rest of today and you should NOT  DRIVE or use heavy machinery until tomorrow (because of the sedation medicines used during the test).    FOLLOW UP: Our staff will call the number listed on your records the next business day following your procedure.  We will call around 7:15- 8:00 am to check on you and address any questions or concerns that you may have regarding the information given to you following your procedure. If we do not reach you, we will leave a message.     If any biopsies were taken you will be contacted by phone or by letter within the next 1-3 weeks.  Please call us  at [phone] if you have not heard about the biopsies in 3 weeks.    SIGNATURES/CONFIDENTIALITY: You and/or your care partner have signed paperwork which will be entered into your electronic medical record.  These signatures attest to the fact that that the information above on your After Visit Summary has been reviewed and is understood.  Full responsibility of the confidentiality of this discharge information lies with you and/or your care-partner.

## 2024-04-03 NOTE — Progress Notes (Signed)
VS by HC °

## 2024-04-04 ENCOUNTER — Telehealth: Payer: Self-pay | Admitting: *Deleted

## 2024-04-04 NOTE — Telephone Encounter (Signed)
" °  Follow up Call-     04/03/2024    2:06 PM  Call back number  Post procedure Call Back phone  # [phone]  Permission to leave phone message Yes     Patient questions:  Do you have a fever, pain , or abdominal swelling? No. Pain Score  0 *  Have you tolerated food without any problems? Yes.    Have you been able to return to your normal activities? Yes.    Do you have any questions about your discharge instructions: Diet   No. Medications  No. Follow up visit  No.  Do you have questions or concerns about your Care? No.  Actions: * If pain score is 4 or above: No action needed, pain <4.   "

## 2024-04-06 LAB — SURGICAL PATHOLOGY

## 2024-04-07 ENCOUNTER — Ambulatory Visit: Payer: Self-pay | Admitting: Internal Medicine

## 2024-04-14 ENCOUNTER — Other Ambulatory Visit (HOSPITAL_BASED_OUTPATIENT_CLINIC_OR_DEPARTMENT_OTHER): Payer: Self-pay

## 2024-04-17 ENCOUNTER — Other Ambulatory Visit (HOSPITAL_BASED_OUTPATIENT_CLINIC_OR_DEPARTMENT_OTHER): Payer: Self-pay
